# Patient Record
Sex: Male | Born: 1980 | Race: White | Hispanic: No | Marital: Married | Smoking: Never smoker
Health system: Southern US, Community
[De-identification: ages and names within clinical notes are randomized; demographics above are authoritative.]

## PROBLEM LIST (undated history)

## (undated) DIAGNOSIS — Z86711 Personal history of pulmonary embolism: Secondary | ICD-10-CM

## (undated) DIAGNOSIS — E7404 McArdle disease: Secondary | ICD-10-CM

## (undated) DIAGNOSIS — Z954 Presence of other heart-valve replacement: Secondary | ICD-10-CM

## (undated) HISTORY — PX: CARDIAC SURGERY: SHX584

## (undated) HISTORY — PX: ABDOMINAL AORTIC ANEURYSM REPAIR: SUR1152

## (undated) HISTORY — PX: AORTIC VALVE REPLACEMENT: SHX41

---

## 2002-11-20 ENCOUNTER — Emergency Department (HOSPITAL_COMMUNITY): Admission: EM | Admit: 2002-11-20 | Discharge: 2002-11-21 | Payer: Self-pay | Admitting: Emergency Medicine

## 2003-06-01 ENCOUNTER — Emergency Department (HOSPITAL_COMMUNITY): Admission: EM | Admit: 2003-06-01 | Discharge: 2003-06-01 | Payer: Self-pay | Admitting: Emergency Medicine

## 2004-03-07 ENCOUNTER — Emergency Department (HOSPITAL_COMMUNITY): Admission: EM | Admit: 2004-03-07 | Discharge: 2004-03-07 | Payer: Self-pay | Admitting: Emergency Medicine

## 2004-04-30 ENCOUNTER — Emergency Department (HOSPITAL_COMMUNITY): Admission: EM | Admit: 2004-04-30 | Discharge: 2004-04-30 | Payer: Self-pay | Admitting: Emergency Medicine

## 2004-05-02 ENCOUNTER — Emergency Department (HOSPITAL_COMMUNITY): Admission: EM | Admit: 2004-05-02 | Discharge: 2004-05-02 | Payer: Self-pay | Admitting: Emergency Medicine

## 2005-09-20 ENCOUNTER — Emergency Department (HOSPITAL_COMMUNITY): Admission: EM | Admit: 2005-09-20 | Discharge: 2005-09-21 | Payer: Self-pay | Admitting: Emergency Medicine

## 2005-09-22 ENCOUNTER — Ambulatory Visit: Payer: Self-pay | Admitting: Family Medicine

## 2006-04-27 ENCOUNTER — Ambulatory Visit: Payer: Self-pay | Admitting: Family Medicine

## 2006-08-22 ENCOUNTER — Emergency Department (HOSPITAL_COMMUNITY): Admission: EM | Admit: 2006-08-22 | Discharge: 2006-08-22 | Payer: Self-pay | Admitting: Emergency Medicine

## 2006-08-23 ENCOUNTER — Ambulatory Visit: Payer: Self-pay | Admitting: Family Medicine

## 2006-10-19 ENCOUNTER — Ambulatory Visit: Payer: Self-pay | Admitting: Family Medicine

## 2006-11-01 ENCOUNTER — Encounter: Admission: RE | Admit: 2006-11-01 | Discharge: 2006-11-01 | Payer: Self-pay | Admitting: Family Medicine

## 2006-12-21 ENCOUNTER — Ambulatory Visit: Payer: Self-pay | Admitting: Family Medicine

## 2007-05-03 ENCOUNTER — Encounter: Payer: Self-pay | Admitting: Family Medicine

## 2008-12-10 ENCOUNTER — Ambulatory Visit: Payer: Self-pay | Admitting: Family Medicine

## 2008-12-10 DIAGNOSIS — S61209A Unspecified open wound of unspecified finger without damage to nail, initial encounter: Secondary | ICD-10-CM | POA: Insufficient documentation

## 2010-03-13 ENCOUNTER — Encounter: Payer: Self-pay | Admitting: Family Medicine

## 2010-03-14 ENCOUNTER — Encounter: Payer: Self-pay | Admitting: Family Medicine

## 2010-06-05 ENCOUNTER — Emergency Department (INDEPENDENT_AMBULATORY_CARE_PROVIDER_SITE_OTHER): Payer: Self-pay

## 2010-06-05 ENCOUNTER — Emergency Department (HOSPITAL_BASED_OUTPATIENT_CLINIC_OR_DEPARTMENT_OTHER)
Admission: EM | Admit: 2010-06-05 | Discharge: 2010-06-05 | Disposition: A | Discharge status: Home / Self Care | Payer: Self-pay | Attending: Emergency Medicine | Admitting: Emergency Medicine

## 2010-06-05 DIAGNOSIS — R079 Chest pain, unspecified: Secondary | ICD-10-CM

## 2010-06-05 LAB — BASIC METABOLIC PANEL
BUN: 7 mg/dL (ref 6–23)
Calcium: 9.4 mg/dL (ref 8.4–10.5)
Creatinine, Ser: 0.9 mg/dL (ref 0.4–1.5)
GFR calc non Af Amer: >60 mL/min (ref >60)

## 2010-06-12 ENCOUNTER — Emergency Department (HOSPITAL_BASED_OUTPATIENT_CLINIC_OR_DEPARTMENT_OTHER)
Admission: EM | Admit: 2010-06-12 | Discharge: 2010-06-12 | Disposition: A | Discharge status: Home / Self Care | Payer: Self-pay | Attending: Emergency Medicine | Admitting: Emergency Medicine

## 2010-06-12 ENCOUNTER — Emergency Department (INDEPENDENT_AMBULATORY_CARE_PROVIDER_SITE_OTHER): Payer: Self-pay

## 2010-06-12 DIAGNOSIS — F411 Generalized anxiety disorder: Secondary | ICD-10-CM | POA: Insufficient documentation

## 2010-06-12 DIAGNOSIS — R51 Headache: Secondary | ICD-10-CM

## 2010-06-12 DIAGNOSIS — T43205A Adverse effect of unspecified antidepressants, initial encounter: Secondary | ICD-10-CM | POA: Insufficient documentation

## 2010-12-06 LAB — URINALYSIS, ROUTINE W REFLEX MICROSCOPIC
Glucose, UA: NEGATIVE
Ketones, ur: NEGATIVE
Specific Gravity, Urine: 1.009
pH: 7

## 2010-12-06 LAB — DIFFERENTIAL
Basophils Relative: 0
Eosinophils Relative: 5
Lymphocytes Relative: 26
Lymphs Abs: 2.3
Monocytes Absolute: 0.7
Neutro Abs: 5.4
Neutrophils Relative %: 60

## 2010-12-06 LAB — COMPREHENSIVE METABOLIC PANEL
Chloride: 106
GFR calc Af Amer: >60
Potassium: 3.7

## 2010-12-06 LAB — CBC
HCT: 46.2
Hemoglobin: 16.3
MCHC: 35.3
MCV: 85
Platelets: 245
RBC: 5.44
WBC: 9

## 2011-06-16 ENCOUNTER — Encounter: Payer: Self-pay | Admitting: *Deleted

## 2011-06-16 ENCOUNTER — Emergency Department
Admission: EM | Admit: 2011-06-16 | Discharge: 2011-06-16 | Disposition: A | Discharge status: Home / Self Care | Payer: Self-pay | Source: Home / Self Care | Attending: Emergency Medicine | Admitting: Emergency Medicine

## 2011-06-16 DIAGNOSIS — R109 Unspecified abdominal pain: Secondary | ICD-10-CM

## 2011-06-16 HISTORY — DX: McArdle disease: E74.04

## 2011-06-16 MED ORDER — DICYCLOMINE HCL 20 MG PO TABS: 20.0000 mg | ORAL_TABLET | Freq: Two times a day (BID) | ORAL | Status: DC

## 2011-06-16 NOTE — ED Notes (Signed)
Pt c/o stomach cramps and diarrhea his whole life, he is concerned that he has IBS. He has a family hx of IBS. He has taken pepto and immodium.

## 2011-06-16 NOTE — ED Provider Notes (Signed)
History     CSN: [id number]  Arrival date & time 06/16/11  1143   First MD Initiated Contact with Patient 06/16/11 1206      Chief Complaint  Patient presents with  . GI Problem    (Consider location/radiation/quality/duration/timing/severity/associated sxs/prior treatment) HPI This patient presents today with some cramps and diarrhea for his whole ice.  He does not have a PCP but is concerned that he has IBS.  His mom also has IBS.  He is taking Pepto-Bismol and Imodium in the past and these seemed to help a little bit.  He has noticed increased cramping this week and is tired of his symptoms so he is coming to be evaluated.  No urinary symptoms, no blood in the stool, no constipation, no nausea or vomiting.  His main symptoms consist of general abdominal cramping.  He has not noticed that it happens with any certain foods, however he did go to [employer] yesterday and thinks this flared up a little bit.  He is not allergic to anything.  He does not take any daily medications other than an occasional Xanax.  He has never seen a gastroenterologist in the past.  Past Medical History  Diagnosis Date  . McArdle's disease     History reviewed. No pertinent past surgical history.  Family History  Problem Relation Age of Onset  . Irritable bowel syndrome Mother   . Cancer Mother     thyroid    History  Substance Use Topics  . Smoking status: Never Smoker   . Smokeless tobacco: Not on file  . Alcohol Use: Yes      Review of Systems  All other systems reviewed and are negative.    Allergies  Review of patient's allergies indicates no known allergies.  Home Medications   Current Outpatient Rx  Name Route Sig Dispense Refill  . ALPRAZOLAM 1 MG PO TABS Oral Take 1 mg by mouth at bedtime as needed.    Marland Kitchen DICYCLOMINE HCL 20 MG PO TABS Oral Take 1 tablet (20 mg total) by mouth 2 (two) times daily. 32 tablet 0    BP 138/89  Pulse 69  Temp(Src) 98.4 F (36.9 C) (Oral)   Resp 16  Ht 5\' 6"  (1.676 m)  Wt 161 lb (73.029 kg)  BMI 25.99 kg/m2  SpO2 99%  Physical Exam  Nursing note and vitals reviewed. Constitutional: He is oriented to person, place, and time. He appears well-developed and well-nourished.  HENT:  Head: Normocephalic and atraumatic.  Eyes: No scleral icterus.  Neck: Neck supple.  Cardiovascular: Regular rhythm and normal heart sounds.   Pulmonary/Chest: Effort normal and breath sounds normal. No respiratory distress.  Abdominal: Soft. Normal appearance and bowel sounds are normal. There is tenderness in the periumbilical area. There is no rigidity, no rebound, no guarding, no CVA tenderness, no tenderness at McBurney's point and negative Murphy's sign.  Neurological: He is alert and oriented to person, place, and time.  Skin: Skin is warm and dry.  Psychiatric: He has a normal mood and affect. His speech is normal.    ED Course  Procedures (including critical care time)  Labs Reviewed - No data to display No results found.   1. Stomach cramps       MDM   This patient's symptoms are consistent with irritable bowel syndrome.  We're going to refer him to a gastroenterologist so can have long-term care for this.  I do not see any red is concerning with appendicitis or  other intra-abdominal processes.  I gave him a prescription for Bentyl to use for the next few days for his acute cramping.  I've also advised a healthy diet.  Also can consider testing such as a lactose-free diet or a gluten-free diet.  Marlaine Hind, MD 06/16/11 1225

## 2011-09-18 ENCOUNTER — Emergency Department (HOSPITAL_BASED_OUTPATIENT_CLINIC_OR_DEPARTMENT_OTHER): Payer: BC Managed Care – PPO

## 2011-09-18 ENCOUNTER — Encounter (HOSPITAL_BASED_OUTPATIENT_CLINIC_OR_DEPARTMENT_OTHER): Payer: Self-pay | Admitting: Emergency Medicine

## 2011-09-18 ENCOUNTER — Emergency Department (HOSPITAL_BASED_OUTPATIENT_CLINIC_OR_DEPARTMENT_OTHER)
Admission: EM | Admit: 2011-09-18 | Discharge: 2011-09-18 | Disposition: A | Discharge status: Home / Self Care | Payer: BC Managed Care – PPO | Attending: Emergency Medicine | Admitting: Emergency Medicine

## 2011-09-18 DIAGNOSIS — R079 Chest pain, unspecified: Secondary | ICD-10-CM | POA: Insufficient documentation

## 2011-09-18 DIAGNOSIS — E7400 Glycogen storage disease, unspecified: Secondary | ICD-10-CM | POA: Insufficient documentation

## 2011-09-18 MED ORDER — IBUPROFEN 800 MG PO TABS
800.0000 mg | ORAL_TABLET | Freq: Once | ORAL | Status: AC
  Administered 2011-09-18: 800 mg via ORAL
  Filled 2011-09-18: qty 1

## 2011-09-18 NOTE — ED Notes (Addendum)
Pt states had right pectoral pain times 1 month. "HX rhabdomyolysis but normally from pulling bigger muscles".  PT denies ShOB, diaphoresis.

## 2011-09-18 NOTE — ED Notes (Signed)
States for over a month has had right sided intermittent sharp pain.  Denies any other associated symptoms.  Denies any recent illness or unusual heavy lifting.

## 2011-09-18 NOTE — ED Provider Notes (Signed)
History     CSN: [id number]  Arrival date & time 09/18/11  4098   First MD Initiated Contact with Patient 09/18/11 [phone]      Chief Complaint  Patient presents with  . Chest Pain    (Consider location/radiation/quality/duration/timing/severity/associated sxs/prior treatment) HPI Patient is a 31 year old male with history of McArdle's disease who presents today complaining of one month of intermittent right-sided chest pain. This began today at rest. Normally this lasts a few minutes and then goes away. Today symptoms persisted and patient thought he should get checked out. He has no other medical problems and his chronic medical issue does not predispose him to thromboembolic disease. Patient has no personal history of DVT, PE, CAD, or obstructive lung disease. He denies any recent cough, fever, GI symptoms, GU symptoms, or shortness of breath. Patient denies any radiation of his pain and says it is very mild. He denies any variation with inspiration. He is not a smoker, has no history of hypertension, has no history of hyperlipidemia, and has no family history of early CAD. Patient is not currently seeing a primary care physician but has plans to obtain a new one. As he does not have a PCP he thought he should come in to get checked out in the emergency department today. There are no other associated or modifying factors. Past Medical History  Diagnosis Date  . McArdle's disease     History reviewed. No pertinent past surgical history.  Family History  Problem Relation Age of Onset  . Irritable bowel syndrome Mother   . Cancer Mother     thyroid    History  Substance Use Topics  . Smoking status: Never Smoker   . Smokeless tobacco: Not on file  . Alcohol Use: Yes      Review of Systems  Constitutional: Negative.   HENT: Negative.   Eyes: Negative.   Respiratory: Negative.   Cardiovascular: Positive for chest pain.  Gastrointestinal: Negative.   Genitourinary: Negative.    Musculoskeletal: Negative.   Skin: Negative.   Neurological: Negative.   Hematological: Negative.   Psychiatric/Behavioral: Negative.   All other systems reviewed and are negative.    Allergies  Review of patient's allergies indicates no known allergies.  Home Medications   Current Outpatient Rx  Name Route Sig Dispense Refill  . ALPRAZOLAM 1 MG PO TABS Oral Take 1 mg by mouth at bedtime as needed.    Marland Kitchen DICYCLOMINE HCL 20 MG PO TABS Oral Take 1 tablet (20 mg total) by mouth 2 (two) times daily. 32 tablet 0    BP 149/84  Pulse 70  Temp 97.7 F (36.5 C) (Oral)  Resp 16  Ht 5\' 6"  (1.676 m)  Wt 162 lb (73.483 kg)  BMI 26.15 kg/m2  SpO2 100%  Physical Exam  Nursing note and vitals reviewed. GEN: Well-developed, well-nourished male in no distress HEENT: Atraumatic, normocephalic. Oropharynx clear without erythema EYES: PERRLA BL, no scleral icterus. NECK: Trachea midline, no meningismus CV: regular rate and rhythm. No murmurs, rubs, or gallops PULM: No respiratory distress.  No crackles, wheezes, or rales. GI: soft, non-tender. No guarding, rebound, or tenderness. + bowel sounds  GU: deferred Neuro: cranial nerves grossly 2-12 intact, no abnormalities of strength or sensation, A and O x 3 MSK: Patient moves all 4 extremities symmetrically, no deformity, edema, or injury noted Skin: No rashes petechiae, purpura, or jaundice Psych: no abnormality of mood   ED Course  Procedures (including critical care time)  Indication: chest  pain Please note this EKG was reviewed extemporaneously by myself.   Date: 09/18/2011  Rate: 65  Rhythm: normal sinus rhythm  QRS Axis: normal  Intervals: normal  ST/T Wave abnormalities: normal  Conduction Disutrbances:none  Narrative Interpretation:   Old EKG Reviewed: unchanged      Labs Reviewed - No data to display Dg Chest 2 View  09/18/2011  *RADIOLOGY REPORT*  Clinical Data: Chest pain  CHEST - 2 VIEW  Comparison:  06/05/2010   Findings:  The heart size and mediastinal contours are within normal limits.  Both lungs are clear.  The visualized skeletal structures are unremarkable.  IMPRESSION: No active cardiopulmonary disease.  Original Report Authenticated By: Judie Petit. Ruel Favors, M.D.     1. Chest pain       MDM  Patient was evaluated by myself. Based on evaluation patient was very low risk for any emergent causes of chest pain such as ACS, thromboembolic disease, or pneumonia. Patient no symptoms to suggest dissection either. Given his low risk profile patient did have a chest x-ray and EKG. Patient was PERC negative and did not require further evaluation for this. EKG was completely within normal limits. Patient was given ibuprofen for his pain. He knew of no injuries and was concerned as he did not like this felt like a muscle pull. This also felt in no way like patient's previous episodes of rhabdomyolysis that he has had associated with his McArdle's disease. Patient was given a dose of ibuprofen. Chest x-ray was performed. This was negative as well. Patient was reassured and told to followup with a primary care physician.        Cyndra Numbers, MD 09/18/11 [phone]

## 2012-09-23 DIAGNOSIS — F419 Anxiety disorder, unspecified: Secondary | ICD-10-CM | POA: Insufficient documentation

## 2015-06-28 ENCOUNTER — Emergency Department (INDEPENDENT_AMBULATORY_CARE_PROVIDER_SITE_OTHER)
Admission: EM | Admit: 2015-06-28 | Discharge: 2015-06-28 | Disposition: A | Discharge status: Home / Self Care | Payer: 59 | Source: Home / Self Care | Attending: Family Medicine | Admitting: Family Medicine

## 2015-06-28 ENCOUNTER — Encounter: Payer: Self-pay | Admitting: *Deleted

## 2015-06-28 DIAGNOSIS — H1131 Conjunctival hemorrhage, right eye: Secondary | ICD-10-CM

## 2015-06-28 DIAGNOSIS — S0501XA Injury of conjunctiva and corneal abrasion without foreign body, right eye, initial encounter: Secondary | ICD-10-CM

## 2015-06-28 MED ORDER — POLYMYXIN B-TRIMETHOPRIM 10000-0.1 UNIT/ML-% OP SOLN: 1.0000 [drp] | OPHTHALMIC | Status: DC

## 2015-06-28 NOTE — ED Provider Notes (Signed)
CSN: [id number]     Arrival date & time 06/28/15  6578 History   First MD Initiated Contact with Patient 06/28/15 [phone]     Chief Complaint  Patient presents with  . Eye Problem   (Consider location/radiation/quality/duration/timing/severity/associated sxs/prior Treatment) HPI The pt is a 35yo male presenting to [facility] with c/o Right eye redness and irritation that started 2 days ago.  Pt states he felt like he had an eyelash in his eye so he rubbed it.  Denies pain or itching since then but has noticed redness and a "black dot" on the right side of his eye.  Pt wears glasses but never contacts. He tried looking for a foreign body in his eyes but did not see any.  Denies recent cough, congestion, vomiting or other instance of increased pressure to his eye.   Past Medical History  Diagnosis Date  . McArdle's disease [facility])    Past Surgical History  Procedure Laterality Date  . Aortic valve replacement    . Abdominal aortic aneurysm repair     Family History  Problem Relation Age of Onset  . Irritable bowel syndrome Mother   . Cancer Mother     thyroid   Social History  Substance Use Topics  . Smoking status: Never Smoker   . Smokeless tobacco: None  . Alcohol Use: No    Review of Systems  Constitutional: Negative for fever and chills.  HENT: Negative for congestion and rhinorrhea.   Eyes: Positive for redness. Negative for photophobia, pain, discharge, itching and visual disturbance.  Respiratory: Negative for cough and wheezing.   Neurological: Negative for dizziness, light-headedness and headaches.    Allergies  Dilaudid  Home Medications   Prior to Admission medications   Medication Sig Start Date End Date Taking? Authorizing Provider  metoprolol succinate (TOPROL-XL) 25 MG 24 hr tablet Take 25 mg by mouth daily.   Yes Historical Provider, MD  rivaroxaban (XARELTO) 20 MG TABS tablet Take 20 mg by mouth daily with supper.   Yes Historical Provider, MD  ALPRAZolam Prudy Feeler)  1 MG tablet Take 1 mg by mouth at bedtime as needed.    Historical Provider, MD  trimethoprim-polymyxin b (POLYTRIM) ophthalmic solution Place 1 drop into the right eye every 4 (four) hours. For 5 days 06/28/15   Junius Finner, PA-C   Meds Ordered and Administered this Visit  Medications - No data to display  BP 143/94 mmHg  Pulse 64  Temp(Src) 98.2 F (36.8 C) (Oral)  Resp 16  Ht  (1.676 m)  Wt 171 lb (77.565 kg)  BMI 27.61 kg/m2  SpO2 99% No data found.   Physical Exam  Constitutional: He is oriented to person, place, and time. He appears well-developed and well-nourished.  HENT:  Head: Normocephalic and atraumatic.  Eyes: EOM and lids are normal. Pupils are equal, round, and reactive to light. Lids are everted and swept, no foreign bodies found. No foreign body present in the right eye. Right conjunctiva is injected. Right conjunctiva has a hemorrhage.    Right eye: small area of subconjunctival hemorrhage.  Small amount of fluorescein uptake inferior to hemorrhage.  No foreign bodies seen.   Neck: Normal range of motion.  Cardiovascular: Normal rate.   Pulmonary/Chest: Effort normal.  Musculoskeletal: Normal range of motion.  Neurological: He is alert and oriented to person, place, and time.  Skin: Skin is warm and dry.  Psychiatric: He has a normal mood and affect. His behavior is normal.  Nursing note  and vitals reviewed.   ED Course  Procedures (including critical care time)  Labs Review Labs Reviewed - No data to display  Imaging Review No results found.   Visual Acuity Review  Right Eye Distance: 20/20 Left Eye Distance: 20/20 Bilateral Distance: 20/20 (w/ glasses)  Right Eye Near:   Left Eye Near:    Bilateral Near:      MDM   1. Right corneal abrasion, initial encounter   2. Subconjunctival hemorrhage of right eye    [facility] with Right eye redness after initial irritation.   Exam c/w subconjunctival hemorrhage and corneal  abrasion.    Reassured pt no foreign bodies seen on exam.  Rx: Polytrim  Encouraged to limit rubbing of his eye. F/u with PCP or ophthalmologist in 2-3 days if not improving, especially if symptoms worsening. Patient verbalized understanding and agreement with treatment plan.      Junius FinnerErin O'Malley, PA-C 06/28/15 [phone]

## 2015-06-28 NOTE — ED Notes (Signed)
Pt c/o RT eye redness and watery d/c x 2 days. Denies itching, pain or fever.

## 2015-06-28 NOTE — Discharge Instructions (Signed)
Corneal Abrasion °The cornea is the clear covering at the front and center of the eye. When looking at the colored portion of the eye (iris), you are looking through the cornea. This very thin tissue is made up of many layers. The surface layer is a single layer of cells (corneal epithelium) and is one of the most sensitive tissues in the body. If a scratch or injury causes the corneal epithelium to come off, it is called a corneal abrasion. If the injury extends to the tissues below the epithelium, the condition is called a corneal ulcer. °CAUSES  °· Scratches. °· Trauma. °· Foreign body in the eye. °Some people have recurrences of abrasions in the area of the original injury even after it has healed (recurrent erosion syndrome). Recurrent erosion syndrome generally improves and goes away with time. °SYMPTOMS  °· Eye pain. °· Difficulty or inability to keep the injured eye open. °· The eye becomes very sensitive to light. °· Recurrent erosions tend to happen suddenly, first thing in the morning, usually after waking up and opening the eye. °DIAGNOSIS  °Your health care provider can diagnose a corneal abrasion during an eye exam. Dye is usually placed in the eye using a drop or a small paper strip moistened by your tears. When the eye is examined with a special light, the abrasion shows up clearly because of the dye. °TREATMENT  °· Small abrasions may be treated with antibiotic drops or ointment alone. °· A pressure patch may be put over the eye. If this is done, follow your doctor's instructions for when to remove the patch. Do not drive or use machines while the eye patch is on. Judging distances is hard to do with a patch on. °If the abrasion becomes infected and spreads to the deeper tissues of the cornea, a corneal ulcer can result. This is serious because it can cause corneal scarring. Corneal scars interfere with light passing through the cornea and cause a loss of vision in the involved eye. °HOME CARE  INSTRUCTIONS °· Use medicine or ointment as directed. Only take over-the-counter or prescription medicines for pain, discomfort, or fever as directed by your health care provider. °· Do not drive or operate machinery if your eye is patched. Your ability to judge distances is impaired. °· If your health care provider has given you a follow-up appointment, it is very important to keep that appointment. Not keeping the appointment could result in a severe eye infection or permanent loss of vision. If there is any problem keeping the appointment, let your health care provider know. °SEEK MEDICAL CARE IF:  °· You have pain, light sensitivity, and a scratchy feeling in one eye or both eyes. °· Your pressure patch keeps loosening up, and you can blink your eye under the patch after treatment. °· Any kind of discharge develops from the eye after treatment or if the lids stick together in the morning. °· You have the same symptoms in the morning as you did with the original abrasion days, weeks, or months after the abrasion healed. °  °This information is not intended to replace advice given to you by your health care provider. Make sure you discuss any questions you have with your health care provider. °  °Document Released: 02/04/2000 Document Revised: 10/28/2014 Document Reviewed: 10/14/2012 °Elsevier Interactive Patient Education ©2016 [employer] ° °Subconjunctival Hemorrhage °Subconjunctival hemorrhage is bleeding that happens between the white part of your eye (sclera) and the clear membrane that covers the outside of   your eye (conjunctiva). There are many tiny blood vessels near the surface of your eye. A subconjunctival hemorrhage happens when one or more of these vessels breaks and bleeds, causing a red patch to appear on your eye. This is similar to a bruise. °Depending on the amount of bleeding, the red patch may only cover a small area of your eye or it may cover the entire visible part of the sclera. If a lot  of blood collects under the conjunctiva, there may also be swelling. Subconjunctival hemorrhages do not affect your vision or cause pain, but your eye may feel irritated if there is swelling. Subconjunctival hemorrhages usually do not require treatment, and they disappear on their own within two weeks. °CAUSES °This condition may be caused by: °· Mild trauma, such as rubbing your eye too hard. °· Severe trauma or blunt injuries. °· Coughing, sneezing, or vomiting. °· Straining, such as when lifting a heavy object. °· High blood pressure. °· Recent eye surgery. °· A history of diabetes. °· Certain medicines, especially blood thinners (anticoagulants). °· Other conditions, such as eye tumors, bleeding disorders, or blood vessel abnormalities. °Subconjunctival hemorrhages can happen without an obvious cause.  °SYMPTOMS  °Symptoms of this condition include: °· A bright red or dark red patch on the white part of the eye. °¨ The red area may spread out to cover a larger area of the eye before it goes away. °¨ The red area may turn brownish-yellow before it goes away. °· Swelling. °· Mild eye irritation. °DIAGNOSIS °This condition is diagnosed with a physical exam. If your subconjunctival hemorrhage was caused by trauma, your health care provider may refer you to an eye specialist (ophthalmologist) or another specialist to check for other injuries. You may have other tests, including: °· An eye exam. °· A blood pressure check. °· Blood tests to check for bleeding disorders. °If your subconjunctival hemorrhage was caused by trauma, X-rays or a CT scan may be done to check for other injuries. °TREATMENT °Usually, no treatment is needed. Your health care provider may recommend eye drops or cold compresses to help with discomfort. °HOME CARE INSTRUCTIONS °· Take over-the-counter and prescription medicines only as directed by your health care provider. °· Use eye drops or cold compresses to help with discomfort as directed by  your health care provider. °· Avoid activities, things, and environments that may irritate or injure your eye. °· Keep all follow-up visits as told by your health care provider. This is important. °SEEK MEDICAL CARE IF: °· You have pain in your eye. °· The bleeding does not go away within 3 weeks. °· You keep getting new subconjunctival hemorrhages. °SEEK IMMEDIATE MEDICAL CARE IF: °· Your vision changes or you have difficulty seeing. °· You suddenly develop severe sensitivity to light. °· You develop a severe headache, persistent vomiting, confusion, or abnormal tiredness (lethargy). °· Your eye seems to bulge or protrude from your eye socket. °· You develop unexplained bruises on your body. °· You have unexplained bleeding in another area of your body. °  °This information is not intended to replace advice given to you by your health care provider. Make sure you discuss any questions you have with your health care provider. °  °Document Released: 02/06/2005 Document Revised: 10/28/2014 Document Reviewed: 04/15/2014 °Elsevier Interactive Patient Education ©2016 [employer] ° °

## 2016-11-06 DIAGNOSIS — Z86711 Personal history of pulmonary embolism: Secondary | ICD-10-CM | POA: Insufficient documentation

## 2017-11-16 ENCOUNTER — Encounter: Payer: Self-pay | Admitting: *Deleted

## 2017-11-16 ENCOUNTER — Emergency Department
Admission: EM | Admit: 2017-11-16 | Discharge: 2017-11-16 | Disposition: A | Discharge status: Home / Self Care | Payer: Self-pay | Source: Home / Self Care | Attending: Family Medicine | Admitting: Family Medicine

## 2017-11-16 ENCOUNTER — Other Ambulatory Visit: Payer: Self-pay

## 2017-11-16 DIAGNOSIS — S29012A Strain of muscle and tendon of back wall of thorax, initial encounter: Secondary | ICD-10-CM

## 2017-11-16 MED ORDER — ACETAMINOPHEN 500 MG PO TABS
1000.0000 mg | ORAL_TABLET | Freq: Once | ORAL | Status: AC
  Administered 2017-11-16: 1000 mg via ORAL

## 2017-11-16 MED ORDER — LIDOCAINE 5 % EX PTCH: MEDICATED_PATCH | CUTANEOUS | 1 refills | Status: DC

## 2017-11-16 MED ORDER — CYCLOBENZAPRINE HCL 10 MG PO TABS: 10.0000 mg | ORAL_TABLET | Freq: Three times a day (TID) | ORAL | 0 refills | Status: DC

## 2017-11-16 NOTE — ED Provider Notes (Signed)
[name]    CSN: [id number] Arrival date & time: 11/16/17  1259     History   Chief Complaint Chief Complaint  Patient presents with  . Back Pain    HPI Randy Frank is a 37 y.o. male.   While pulling a heavy microwave oven out of his car trunk one hour ago, patient felt a popping sensation in his right mid-back, followed by severe pain.    The history is provided by the patient.  Back Pain  Location:  Thoracic spine Quality:  Shooting and stabbing Radiates to:  Does not radiate Pain severity:  Severe Pain is:  Same all the time Onset quality:  Gradual Duration:  1 hour Timing:  Constant Progression:  Unchanged Context: lifting heavy objects   Relieved by:  None tried Worsened by:  Deep breathing and movement Ineffective treatments:  None tried Associated symptoms: no abdominal pain, no bladder incontinence, no bowel incontinence, no chest pain, no dysuria, no fever, no headaches, no leg pain, no numbness, no paresthesias, no pelvic pain, no perianal numbness, no tingling, no weakness and no weight loss     Past Medical History:  Diagnosis Date  . 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 LACERATION OF FINGER 12/10/2008    Past Surgical History:  Procedure Laterality Date  . ABDOMINAL AORTIC ANEURYSM REPAIR    . AORTIC VALVE REPLACEMENT         Home Medications    Prior to Admission medications   Medication Sig Start Date End Date Taking? Authorizing Provider  metoprolol succinate (TOPROL-XL) 25 MG 24 hr tablet Take 25 mg by mouth daily.   Yes [provider]  rivaroxaban (XARELTO) 10 MG TABS tablet Take 10 mg by mouth daily.   Yes [provider]  ALPRAZolam Prudy Feeler) 1 MG tablet Take 1 mg by mouth at bedtime as needed.    [provider]  cyclobenzaprine (FLEXERIL) 10 MG tablet Take 1 tablet (10 mg total) by mouth 3 (three) times daily. 11/16/17   Lattie Haw, MD    lidocaine (LIDODERM) 5 % Apply one patch to affected area daily.  Remove & Discard patch within 12 hours. 11/16/17   Lattie Haw, MD    Family History Family History  Problem Relation Age of Onset  . Irritable bowel syndrome Mother   . Cancer Mother        thyroid    Social History Social History   Tobacco Use  . Smoking status: Never Smoker  . Smokeless tobacco: Never Used  Substance Use Topics  . Alcohol use: No  . Drug use: No     Allergies   Dilaudid [hydromorphone]   Review of Systems Review of Systems  Constitutional: Negative for fever and weight loss.  Cardiovascular: Negative for chest pain.  Gastrointestinal: Negative for abdominal pain and bowel incontinence.  Genitourinary: Negative for bladder incontinence, dysuria and pelvic pain.  Musculoskeletal: Positive for back pain.  Neurological: Negative for tingling, weakness, numbness, headaches and paresthesias.  All other systems reviewed and are negative.    Physical Exam Triage Vital Signs ED Triage Vitals  Enc Vitals Group     BP 11/16/17 1315 137/81     Pulse Rate 11/16/17 1315 60     Resp 11/16/17 1315 18     Temp --      Temp src --      SpO2 11/16/17 1315 100 %  Weight 11/16/17 1317 165 lb (74.8 kg)     Height --      Head Circumference --      Peak Flow --      Pain Score 11/16/17 1316 8     Pain Loc --      Pain Edu? --      Excl. in [place]? --    No data found.  Updated Vital Signs BP 137/81 (BP Location: Right Arm)   Pulse 60   Resp 18   Wt 74.8 kg   SpO2 100%   BMI 26.63 kg/m   Visual Acuity Right Eye Distance:   Left Eye Distance:   Bilateral Distance:    Right Eye Near:   Left Eye Near:    Bilateral Near:     Physical Exam  Constitutional: He appears well-developed and well-nourished. No distress.  HENT:  Head: Atraumatic.  Eyes: Pupils are equal, round, and reactive to light. Conjunctivae are normal.  Neck: Normal range of motion.  Cardiovascular: Normal  heart sounds.  Pulmonary/Chest: Breath sounds normal.  Abdominal: There is no tenderness.  Musculoskeletal: He exhibits no edema or tenderness.       Back:  Tenderness to palpation mid-thoracic paraspinous muscles and intercostal muscles as noted on diagram.   Neurological: He is alert. He displays normal reflexes. No cranial nerve deficit.  Skin: Skin is warm and dry.  Nursing note and vitals reviewed.    UC Treatments / Results  Labs (all labs ordered are listed, but only abnormal results are displayed) Labs Reviewed - No data to display  EKG None  Radiology No results found.  Procedures Procedures (including critical care time)  Medications Ordered in UC Medications  acetaminophen (TYLENOL) tablet 1,000 mg (1,000 mg Oral Given 11/16/17 1325)    Initial Impression / Assessment and Plan / UC Course  I have reviewed the triage vital signs and the nursing notes.  Pertinent labs & imaging results that were available during my care of the patient were reviewed by me and considered in my medical decision making (see chart for details).    Begin Flexeril and Lidocaine patch. Followup with Dr. Rodney Langton or Dr. Clementeen [facility] ([facility]) if not improving about two weeks.    Final Clinical Impressions(s) / UC Diagnoses   Final diagnoses:  Strain of thoracic back region     Discharge Instructions     Apply ice pack for 20 to 30 minutes, 3 to 4 times daily  Continue until pain and swelling decrease.  May take Tylenol as needed for pain.    ED Prescriptions    Medication Sig Dispense Auth. Provider   cyclobenzaprine (FLEXERIL) 10 MG tablet Take 1 tablet (10 mg total) by mouth 3 (three) times daily. 30 tablet Lattie Haw, MD   lidocaine (LIDODERM) 5 % Apply one patch to affected area daily.  Remove & Discard patch within 12 hours. 12 patch Lattie Haw, MD        Lattie Haw, MD 11/19/17 2150

## 2017-11-16 NOTE — ED Triage Notes (Signed)
Patient reports while pulling a microwave out of his trunk, he turned and felt a pop and instant severe pain. Pain is in mid-back. No previous injuries.

## 2017-11-16 NOTE — Discharge Instructions (Signed)
Apply ice pack for 20 to 30 minutes, 3 to 4 times daily  Continue until pain and swelling decrease.  May take Tylenol as needed for pain. 

## 2019-02-07 ENCOUNTER — Emergency Department
Admission: EM | Admit: 2019-02-07 | Discharge: 2019-02-07 | Disposition: A | Discharge status: Home / Self Care | Payer: Managed Care, Other (non HMO) | Source: Home / Self Care | Attending: Family Medicine | Admitting: Family Medicine

## 2019-02-07 ENCOUNTER — Encounter: Payer: Self-pay | Admitting: Emergency Medicine

## 2019-02-07 ENCOUNTER — Other Ambulatory Visit: Payer: Self-pay

## 2019-02-07 DIAGNOSIS — R599 Enlarged lymph nodes, unspecified: Secondary | ICD-10-CM

## 2019-02-07 LAB — POCT RAPID STREP A (OFFICE): Rapid Strep A Screen: NEGATIVE

## 2019-02-07 NOTE — ED Triage Notes (Signed)
Woke up this morning with swollen gland, left neck

## 2019-02-07 NOTE — Discharge Instructions (Addendum)
May take Ibuprofen 200mg , 4 tabs every 8 hours with food if needed for sore throat, headache, etc.

## 2019-02-09 LAB — CULTURE, GROUP A STREP
MICRO NUMBER:: 1216260
SPECIMEN QUALITY:: ADEQUATE

## 2019-02-09 LAB — STREP A DNA PROBE

## 2019-02-16 NOTE — ED Provider Notes (Signed)
[name]    CSN: [id number] Arrival date & time: 02/07/19  1142      History   Chief Complaint Chief Complaint  Patient presents with  . Adenopathy    HPI Randy Frank is a 38 y.o. male.   Patient awoke this morning with soreness in a left neck lymph node without other symptoms.  He denies URI symptoms, feels well, and has no sore throat.  The history is provided by the patient.    Past Medical History:  Diagnosis Date  . 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 LACERATION OF FINGER 12/10/2008    Past Surgical History:  Procedure Laterality Date  . ABDOMINAL AORTIC ANEURYSM REPAIR    . AORTIC VALVE REPLACEMENT    . CARDIAC SURGERY         Home Medications    Prior to Admission medications   Medication Sig Start Date End Date Taking? Authorizing Provider  lisinopril (ZESTRIL) 10 MG tablet Take 10 mg by mouth daily.   Yes [provider]  ALPRAZolam Duanne Moron) 1 MG tablet Take 1 mg by mouth at bedtime as needed.    [provider]  cyclobenzaprine (FLEXERIL) 10 MG tablet Take 1 tablet (10 mg total) by mouth 3 (three) times daily. 11/16/17   Kandra Nicolas, MD  lidocaine (LIDODERM) 5 % Apply one patch to affected area daily.  Remove & Discard patch within 12 hours. 11/16/17   Kandra Nicolas, MD  metoprolol succinate (TOPROL-XL) 25 MG 24 hr tablet Take 25 mg by mouth daily.    [provider]  rivaroxaban (XARELTO) 10 MG TABS tablet Take 10 mg by mouth daily.    [provider]    Family History Family History  Problem Relation Age of Onset  . Irritable bowel syndrome Mother   . Cancer Mother        thyroid    Social History Social History   Tobacco Use  . Smoking status: Never Smoker  . Smokeless tobacco: Never Used  Substance Use Topics  . Alcohol use: No  . Drug use: No     Allergies   Dilaudid [hydromorphone]   Review of Systems Review of  Systems No sore throat No cough No pleuritic pain No wheezing No nasal congestion No post-nasal drainage No sinus pain/pressure No itchy/red eyes No earache No hemoptysis No SOB No fever/chills No nausea No vomiting No abdominal pain No diarrhea No urinary symptoms No skin rash No fatigue No myalgias No headache   Physical Exam Triage Vital Signs ED Triage Vitals  Enc Vitals Group     BP 02/07/19 1238 125/88     Pulse Rate 02/07/19 1238 66     Resp --      Temp 02/07/19 1238 98 F (36.7 C)     Temp Source 02/07/19 1238 Oral     SpO2 02/07/19 1238 97 %     Weight 02/07/19 1240 172 lb (78 kg)     Height 02/07/19 1240 5\' 6"  (1.676 m)     Head Circumference --      Peak Flow --      Pain Score 02/07/19 1239 5     Pain Loc --      Pain Edu? --      Excl. in [place]? --    No data found.  Updated Vital Signs BP 125/88 (BP Location: Right Arm)   Pulse 66  Temp 98 F (36.7 C) (Oral)   Ht 5\' 6"  (1.676 m)   Wt 78 kg   SpO2 97%   BMI 27.76 kg/m   Visual Acuity Right Eye Distance:   Left Eye Distance:   Bilateral Distance:    Right Eye Near:   Left Eye Near:    Bilateral Near:     Physical Exam Nursing notes and Vital Signs reviewed. Appearance:  Patient appears stated age, and in no acute distress Eyes:  Pupils are equal, round, and reactive to light and accomodation.  Extraocular movement is intact.  Conjunctivae are not inflamed  Ears:  Canals normal.  Tympanic membranes normal.  Nose:  Normal turbinates.  No sinus tenderness.  Pharynx:  Normal Neck:  Supple.  Tender slightly enlarged left tonsillar node  Lungs:  Clear to auscultation.  Breath sounds are equal.  Moving air well. Heart:  Regular rate and rhythm without murmurs, rubs, or gallops.  Abdomen:  Nontender without masses or hepatosplenomegaly.  Bowel sounds are present.  No CVA or flank tenderness.  Extremities:  No edema.  Skin:  No rash present.   UC Treatments / Results  Labs (all labs  ordered are listed, but only abnormal results are displayed) Labs Reviewed  STREP A DNA PROBE  CULTURE, GROUP A STREP  POCT RAPID STREP A (OFFICE) negative    EKG   Radiology No results found.  Procedures Procedures (including critical care time)  Medications Ordered in UC Medications - No data to display  Initial Impression / Assessment and Plan / UC Course  I have reviewed the triage vital signs and the nursing notes.  Pertinent labs & imaging results that were available during my care of the patient were reviewed by me and considered in my medical decision making (see chart for details).    Treat symptomatically for now.  Throat culture pending.  Final Clinical Impressions(s) / UC Diagnoses   Final diagnoses:  Swollen lymph nodes     Discharge Instructions     May take Ibuprofen 200mg , 4 tabs every 8 hours with food if needed for sore throat, headache, etc.   ED Prescriptions    None        , MD 02/16/19 1545

## 2019-02-17 NOTE — ED Notes (Signed)
Pt here for updated note. Wanted it to say return tomorrow but we will not write out for 10 days. Needs to be seen again if so.

## 2019-09-23 ENCOUNTER — Ambulatory Visit: Payer: Self-pay

## 2019-10-12 ENCOUNTER — Other Ambulatory Visit: Payer: Self-pay

## 2019-10-12 ENCOUNTER — Emergency Department
Admission: RE | Admit: 2019-10-12 | Discharge: 2019-10-12 | Disposition: A | Discharge status: Home / Self Care | Payer: Self-pay | Source: Ambulatory Visit

## 2019-10-12 ENCOUNTER — Emergency Department (INDEPENDENT_AMBULATORY_CARE_PROVIDER_SITE_OTHER): Payer: Self-pay

## 2019-10-12 VITALS — BP 120/81 | HR 87 | Temp 99.5°F

## 2019-10-12 DIAGNOSIS — R05 Cough: Secondary | ICD-10-CM

## 2019-10-12 DIAGNOSIS — R509 Fever, unspecified: Secondary | ICD-10-CM

## 2019-10-12 DIAGNOSIS — R0602 Shortness of breath: Secondary | ICD-10-CM

## 2019-10-12 DIAGNOSIS — Z20822 Contact with and (suspected) exposure to covid-19: Secondary | ICD-10-CM

## 2019-10-12 DIAGNOSIS — J069 Acute upper respiratory infection, unspecified: Secondary | ICD-10-CM

## 2019-10-12 MED ORDER — DOXYCYCLINE HYCLATE 100 MG PO CAPS: 100.0000 mg | ORAL_CAPSULE | Freq: Two times a day (BID) | ORAL | 0 refills | Status: AC

## 2019-10-12 MED ORDER — ALBUTEROL SULFATE HFA 108 (90 BASE) MCG/ACT IN AERS: 1.0000 | INHALATION_SPRAY | Freq: Four times a day (QID) | RESPIRATORY_TRACT | 0 refills | Status: AC | PRN

## 2019-10-12 MED ORDER — BENZONATATE 100 MG PO CAPS: 100.0000 mg | ORAL_CAPSULE | Freq: Three times a day (TID) | ORAL | 0 refills | Status: DC

## 2019-10-12 MED ORDER — PREDNISONE 20 MG PO TABS: 40.0000 mg | ORAL_TABLET | Freq: Every day | ORAL | 0 refills | Status: AC

## 2019-10-12 NOTE — ED Triage Notes (Signed)
Patient has had a cough x 10 days, fever, wife tested positive for COVID several weeks ago.  Patient has taken 3 COVID tests all negative.  Patient is SOB.  Patient is not vaccinated.

## 2019-10-12 NOTE — ED Provider Notes (Signed)
[facility]    CSN: [id number] Arrival date & time: 10/12/19  0954      History   Chief Complaint Chief Complaint  Patient presents with  . Cough    HPI Randy Frank is a 39 y.o. male.   HPI  Randy Frank is a 39 y.o. male presenting to UC with c/o cough for 10 days, low grade fever. Denies chest pain but has had mild SOB.  He has had 2 negative home Covid-19 tests and one negative covid test at a local pharmacy.  Pt is not vaccinated. States his wife tested positive for Covid 3 weeks ago.     Past Medical History:  Diagnosis Date  . 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 LACERATION OF FINGER 12/10/2008    Past Surgical History:  Procedure Laterality Date  . ABDOMINAL AORTIC ANEURYSM REPAIR    . AORTIC VALVE REPLACEMENT    . CARDIAC SURGERY         Home Medications    Prior to Admission medications   Medication Sig Start Date End Date Taking? Authorizing Provider  ALPRAZolam Prudy Feeler) 1 MG tablet Take 1 mg by mouth at bedtime as needed.   Yes [provider]  cyclobenzaprine (FLEXERIL) 10 MG tablet Take 1 tablet (10 mg total) by mouth 3 (three) times daily. 11/16/17  Yes Lattie Haw, MD  lidocaine (LIDODERM) 5 % Apply one patch to affected area daily.  Remove & Discard patch within 12 hours. 11/16/17  Yes Lattie Haw, MD  lisinopril (ZESTRIL) 10 MG tablet Take 10 mg by mouth daily.   Yes [provider]  metoprolol succinate (TOPROL-XL) 25 MG 24 hr tablet Take 25 mg by mouth daily.   Yes [provider]  rivaroxaban (XARELTO) 10 MG TABS tablet Take 10 mg by mouth daily.   Yes [provider]  albuterol (VENTOLIN HFA) 108 (90 Base) MCG/ACT inhaler Inhale 1-2 puffs into the lungs every 6 (six) hours as needed for wheezing or shortness of breath. 10/12/19   Lurene Shadow, PA-C  benzonatate (TESSALON) 100 MG capsule Take 1-2 capsules (100-200 mg total) by mouth  every 8 (eight) hours. 10/12/19   Lurene Shadow, PA-C  doxycycline (VIBRAMYCIN) 100 MG capsule Take 1 capsule (100 mg total) by mouth 2 (two) times daily for 7 days. 10/12/19 10/19/19  Lurene Shadow, PA-C  predniSONE (DELTASONE) 20 MG tablet Take 2 tablets (40 mg total) by mouth daily with breakfast for 4 days. 10/12/19 10/16/19  Lurene Shadow, PA-C    Family History Family History  Problem Relation Age of Onset  . Irritable bowel syndrome Mother   . Cancer Mother        thyroid    Social History Social History   Tobacco Use  . Smoking status: Never Smoker  . Smokeless tobacco: Never Used  Vaping Use  . Vaping Use: Never used  Substance Use Topics  . Alcohol use: No  . Drug use: No     Allergies   Dilaudid [hydromorphone]   Review of Systems Review of Systems  Constitutional: Positive for fatigue and fever. Negative for chills.  HENT: Positive for congestion and postnasal drip. Negative for ear pain, sore throat, trouble swallowing and voice change.   Respiratory: Positive for cough, chest tightness and shortness of breath.   Cardiovascular: Negative for chest pain and palpitations.  Gastrointestinal: Negative for abdominal pain, diarrhea,  nausea and vomiting.  Musculoskeletal: Negative for arthralgias, back pain and myalgias.  Skin: Negative for rash.  All other systems reviewed and are negative.    Physical Exam Triage Vital Signs ED Triage Vitals  Enc Vitals Group     BP 10/12/19 1048 120/81     Pulse Rate 10/12/19 1048 87     Resp --      Temp 10/12/19 1048 99.5 F (37.5 C)     Temp Source 10/12/19 1048 Oral     SpO2 10/12/19 1048 97 %     Weight --      Height --      Head Circumference --      Peak Flow --      Pain Score 10/12/19 1049 0     Pain Loc --      Pain Edu? --      Excl. in [place]? --    No data found.  Updated Vital Signs BP 120/81 (BP Location: Left Arm)   Pulse 87   Temp 99.5 F (37.5 C) (Oral)   SpO2 97%   Visual Acuity Right  Eye Distance:   Left Eye Distance:   Bilateral Distance:    Right Eye Near:   Left Eye Near:    Bilateral Near:     Physical Exam Vitals and nursing note reviewed.  Constitutional:      General: He is not in acute distress.    Appearance: Normal appearance. He is well-developed. He is not ill-appearing, toxic-appearing or diaphoretic.  HENT:     Head: Normocephalic and atraumatic.     Right Ear: Tympanic membrane and ear canal normal.     Left Ear: Tympanic membrane and ear canal normal.     Nose: Nose normal.     Mouth/Throat:     Lips: Pink.     Mouth: Mucous membranes are moist.     Pharynx: Oropharynx is clear. Uvula midline. No pharyngeal swelling, oropharyngeal exudate, posterior oropharyngeal erythema or uvula swelling.  Cardiovascular:     Rate and Rhythm: Normal rate and regular rhythm.  Pulmonary:     Effort: Pulmonary effort is normal. No respiratory distress.     Breath sounds: No stridor. Rhonchi (faint diffuse ) present. No wheezing or rales.  Musculoskeletal:        General: Normal range of motion.     Cervical back: Normal range of motion and neck supple. No tenderness.  Lymphadenopathy:     Cervical: No cervical adenopathy.  Skin:    General: Skin is warm and dry.  Neurological:     Mental Status: He is alert and oriented to person, place, and time.  Psychiatric:        Behavior: Behavior normal.      UC Treatments / Results  Labs (all labs ordered are listed, but only abnormal results are displayed) Labs Reviewed  NOVEL CORONAVIRUS, NAA   Narrative:    Performed at:  [address] [place], [place]  [id number] Lab Director: Jolene Schimke MD, Phone:  [phone]  SARS-COV-2, NAA 2 DAY TAT   Narrative:    Performed at:  [address] [place], [place]  [id number] Lab Director: Jolene Schimke MD, Phone:  [phone]    EKG   Radiology CLINICAL DATA:  39 year old male with history of cough and  congestion. Shortness of breath.  EXAM: CHEST - 2 VIEW  COMPARISON:  Chest x-ray 09/18/2011.  FINDINGS: Lung volumes are normal. No  consolidative airspace disease. No pleural effusions. No pneumothorax. No pulmonary nodule or mass noted. Pulmonary vasculature and the cardiomediastinal silhouette are within normal limits. Status post median sternotomy.  IMPRESSION: 1.  No radiographic evidence of acute cardiopulmonary disease.   Electronically Signed   By: Trudie Reed M.D.   On: 10/12/2019 11:44    Procedures Procedures (including critical care time)  Medications Ordered in UC Medications - No data to display  Initial Impression / Assessment and Plan / UC Course  I have reviewed the triage vital signs and the nursing notes.  Pertinent labs & imaging results that were available during my care of the patient were reviewed by me and considered in my medical decision making (see chart for details).     Reviewed imaging with pt althought normal CXR, pt started on doxycycline for persistent cough and fever Encouraged f/u with PCP Discussed symptoms that warrant emergent care in the ED. AVS given   Final Clinical Impressions(s) / UC Diagnoses   Final diagnoses:  Acute upper respiratory infection  Exposure to COVID-19 virus     Discharge Instructions      Call to schedule a follow up appointment with your primary care provider later this week for recheck of symptoms if not improving.  Call 911 or have someone drive you to the hospital if symptoms worsening- severe chest pain, trouble breathing, dizziness/passing out or other new concerning symptoms develop.   Due to concern for possibly having Covid-19, it is advised that you self-isolate at home until test results come back, usually 2-3 days.  If positive, it is recommended you stay isolated for at least 10 days after symptom onset and 24 hours after last fever without taking medication (whichever is  longer).  If you MUST go out, please wear a mask at all times, limit contact with others.   If your test is negative, you still have plenty of time to get the Covid vaccine. It is recommended you schedule an appointment to get your vaccine once you get over this current illness.  Please ask your primary care provider about any questions/concerns related to the vaccine.      ED Prescriptions    Medication Sig Dispense Auth. Provider   doxycycline (VIBRAMYCIN) 100 MG capsule Take 1 capsule (100 mg total) by mouth 2 (two) times daily for 7 days. 14 capsule Doroteo Glassman, Erin O, PA-C   benzonatate (TESSALON) 100 MG capsule Take 1-2 capsules (100-200 mg total) by mouth every 8 (eight) hours. 21 capsule Doroteo Glassman, Erin O, PA-C   predniSONE (DELTASONE) 20 MG tablet Take 2 tablets (40 mg total) by mouth daily with breakfast for 4 days. 8 tablet Doroteo Glassman, Erin O, PA-C   albuterol (VENTOLIN HFA) 108 (90 Base) MCG/ACT inhaler Inhale 1-2 puffs into the lungs every 6 (six) hours as needed for wheezing or shortness of breath. 18 g Lurene Shadow, PA-C     PDMP not reviewed this encounter.   Lurene Shadow, [place] 10/15/19 2034

## 2019-10-12 NOTE — Discharge Instructions (Addendum)
  Call to schedule a follow up appointment with your primary care provider later this week for recheck of symptoms if not improving.  Call 911 or have someone drive you to the hospital if symptoms worsening- severe chest pain, trouble breathing, dizziness/passing out or other new concerning symptoms develop.   Due to concern for possibly having Covid-19, it is advised that you self-isolate at home until test results come back, usually 2-3 days.  If positive, it is recommended you stay isolated for at least 10 days after symptom onset and 24 hours after last fever without taking medication (whichever is longer).  If you MUST go out, please wear a mask at all times, limit contact with others.   If your test is negative, you still have plenty of time to get the Covid vaccine. It is recommended you schedule an appointment to get your vaccine once you get over this current illness.  Please ask your primary care provider about any questions/concerns related to the vaccine.

## 2019-10-14 LAB — SARS-COV-2, NAA 2 DAY TAT

## 2019-10-14 LAB — NOVEL CORONAVIRUS, NAA: SARS-CoV-2, NAA: NOT DETECTED

## 2019-12-12 DIAGNOSIS — I10 Essential (primary) hypertension: Secondary | ICD-10-CM | POA: Insufficient documentation

## 2019-12-26 ENCOUNTER — Ambulatory Visit: Payer: Self-pay

## 2020-12-20 DIAGNOSIS — Z8774 Personal history of (corrected) congenital malformations of heart and circulatory system: Secondary | ICD-10-CM | POA: Insufficient documentation

## 2020-12-20 DIAGNOSIS — Z952 Presence of prosthetic heart valve: Secondary | ICD-10-CM | POA: Insufficient documentation

## 2021-04-16 ENCOUNTER — Emergency Department
Admission: RE | Admit: 2021-04-16 | Discharge: 2021-04-16 | Disposition: A | Discharge status: Home / Self Care | Payer: Self-pay | Source: Ambulatory Visit

## 2021-04-16 ENCOUNTER — Other Ambulatory Visit: Payer: Self-pay

## 2021-04-16 VITALS — BP 147/74 | HR 68 | Temp 97.8°F | Resp 20 | Ht 66.0 in | Wt 174.0 lb

## 2021-04-16 DIAGNOSIS — H6692 Otitis media, unspecified, left ear: Secondary | ICD-10-CM

## 2021-04-16 DIAGNOSIS — K122 Cellulitis and abscess of mouth: Secondary | ICD-10-CM

## 2021-04-16 DIAGNOSIS — H6122 Impacted cerumen, left ear: Secondary | ICD-10-CM

## 2021-04-16 MED ORDER — AMOXICILLIN-POT CLAVULANATE 875-125 MG PO TABS: 1.0000 | ORAL_TABLET | Freq: Two times a day (BID) | ORAL | 0 refills | Status: DC

## 2021-04-16 NOTE — ED Triage Notes (Signed)
Pt presents to Urgent Care with c/o L ear pain and L-sided throat pain x 3 days.

## 2021-04-16 NOTE — ED Provider Notes (Signed)
Randy Frank CARE    CSN: [id number] Arrival date & time: 04/16/21  1056      History   Chief Complaint Chief Complaint  Patient presents with   Otalgia   Sore Throat    HPI Randy Frank is a 41 y.o. male.   HPI 41 year old male presents with left ear pain and left-sided throat pain for 3 days.  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  Past Medical History:  Diagnosis Date   McArdle's disease [facility])     Patient Active Problem List   Diagnosis Date Noted   LACERATION OF FINGER 12/10/2008    Past Surgical History:  Procedure Laterality Date   ABDOMINAL AORTIC ANEURYSM REPAIR     AORTIC VALVE REPLACEMENT     CARDIAC SURGERY         Home Medications    Prior to Admission medications   Medication Sig Start Date End Date Taking? Authorizing Provider  amoxicillin-clavulanate (AUGMENTIN) 875-125 MG tablet Take 1 tablet by mouth every 12 (twelve) hours. 04/16/21  Yes Trevor Iha, FNP  citalopram (CELEXA) 10 MG tablet Take 1 tablet by mouth daily. 01/23/17  Yes [provider]  albuterol (VENTOLIN HFA) 108 (90 Base) MCG/ACT inhaler Inhale 1-2 puffs into the lungs every 6 (six) hours as needed for wheezing or shortness of breath. 10/12/19   Lurene Shadow, PA-C  ALPRAZolam Prudy Feeler) 1 MG tablet Take 1 mg by mouth at bedtime as needed.    [provider]  benzonatate (TESSALON) 100 MG capsule Take 1-2 capsules (100-200 mg total) by mouth every 8 (eight) hours. 10/12/19   Lurene Shadow, PA-C  cyclobenzaprine (FLEXERIL) 10 MG tablet Take 1 tablet (10 mg total) by mouth 3 (three) times daily. 11/16/17   Lattie Haw, MD  lidocaine (LIDODERM) 5 % Apply one patch to affected area daily.  Remove & Discard patch within 12 hours. 11/16/17   Lattie Haw, MD  lisinopril (ZESTRIL) 10 MG tablet Take 10 mg by mouth daily.    [provider]  metoprolol succinate (TOPROL-XL) 25 MG 24  hr tablet Take 25 mg by mouth daily.    [provider]  rivaroxaban (XARELTO) 10 MG TABS tablet Take 10 mg by mouth daily.    [provider]    Family History Family History  Problem Relation Age of Onset   Irritable bowel syndrome Mother    Cancer Mother        thyroid    Social History Social History   Tobacco Use   Smoking status: Never   Smokeless tobacco: Never  Vaping Use   Vaping Use: Never used  Substance Use Topics   Alcohol use: No   Drug use: No     Allergies   Dilaudid [hydromorphone]   Review of Systems Review of Systems  HENT:  Positive for ear pain and sore throat.   All other systems reviewed and are negative.   Physical Exam Triage Vital Signs ED Triage Vitals  Enc Vitals Group     BP 04/16/21 1119 (!) 147/74     Pulse Rate 04/16/21 1119 68     Resp 04/16/21 1119 20     Temp 04/16/21 1119 97.8 F (36.6 C)     Temp Source 04/16/21 1119 Oral     SpO2 04/16/21 1119 98 %     Weight 04/16/21 1109 174 lb (78.9 kg)     Height 04/16/21 1109  5\' 6"  (1.676 m)     Head Circumference --      Peak Flow --      Pain Score 04/16/21 1109 6     Pain Loc --      Pain Edu? --      Excl. in GC? --    No data found.  Updated Vital Signs BP (!) 147/74 (BP Location: Right Arm)    Pulse 68    Temp 97.8 F (36.6 C) (Oral)    Resp 20    Ht 5\' 6"  (1.676 m)    Wt 174 lb (78.9 kg)    SpO2 98%    BMI 28.08 kg/m     Physical Exam Vitals and nursing note reviewed.  Constitutional:      General: He is not in acute distress.    Appearance: Normal appearance. He is well-developed and normal weight. He is not ill-appearing.  HENT:     Head: Normocephalic and atraumatic.     Right Ear: Hearing, tympanic membrane, ear canal and external ear normal.     Left Ear: Hearing and external ear normal. There is impacted cerumen.     Ears:     Comments: Left ear: EAC-impacted cerumen unable to visualize TM; post Left EAC lavage: EAC-clear, Left  TM-erythematous, red rimmed, retracted    Mouth/Throat:     Mouth: Mucous membranes are moist.     Pharynx: Uvula midline. Pharyngeal swelling and uvula swelling present.  Eyes:     Extraocular Movements: Extraocular movements intact.     Conjunctiva/sclera: Conjunctivae normal.     Pupils: Pupils are equal, round, and reactive to light.  Cardiovascular:     Rate and Rhythm: Normal rate and regular rhythm.     Pulses: Normal pulses.     Heart sounds: Normal heart sounds.  Pulmonary:     Effort: Pulmonary effort is normal.     Breath sounds: Normal breath sounds.  Musculoskeletal:     Cervical back: Normal range of motion and neck supple. Tenderness present.  Lymphadenopathy:     Cervical: Cervical adenopathy present.  Skin:    General: Skin is warm and dry.  Neurological:     General: No focal deficit present.     Mental Status: He is alert and oriented to person, place, and time.     UC Treatments / Results  Labs (all labs ordered are listed, but only abnormal results are displayed) Labs Reviewed - No data to display  EKG   Radiology No results found.  Procedures Procedures (including critical care time)  Medications Ordered in UC Medications - No data to display  Initial Impression / Assessment and Plan / UC Course  I have reviewed the triage vital signs and the nursing notes.  Pertinent labs & imaging results that were available during my care of the patient were reviewed by me and considered in my medical decision making (see chart for details).     MDM: 1.  Uvulitis-Rx'd Augmentin; 2.  Impacted cerumen of left ear-resolved with ear lavage; 3.  Acute left otitis media-Rx'd Augmentin. Advised patient to take medication as directed with food to completion.  Encouraged patient to increase daily water intake while taking this medication.  Patient discharged home, hemodynamically stable. Final Clinical Impressions(s) / UC Diagnoses   Final diagnoses:  Uvulitis   Impacted cerumen of left ear  Acute left otitis media     Discharge Instructions      Advised patient to take medication as  directed with food to completion.  Encouraged patient to increase daily water intake while taking this medication.     ED Prescriptions     Medication Sig Dispense Auth. Provider   amoxicillin-clavulanate (AUGMENTIN) 875-125 MG tablet Take 1 tablet by mouth every 12 (twelve) hours. 14 tablet Trevor Iha, FNP      PDMP not reviewed this encounter.   Trevor Iha, FNP 04/16/21 1220

## 2021-04-16 NOTE — Discharge Instructions (Addendum)
Advised patient to take medication as directed with food to completion.  Encouraged patient to increase daily water intake while taking this medication. 

## 2021-08-17 ENCOUNTER — Ambulatory Visit: Payer: Self-pay

## 2021-09-11 ENCOUNTER — Ambulatory Visit: Payer: Self-pay

## 2021-09-12 ENCOUNTER — Emergency Department
Admission: RE | Admit: 2021-09-12 | Discharge: 2021-09-12 | Disposition: A | Discharge status: Home / Self Care | Payer: Self-pay | Source: Ambulatory Visit | Attending: Family Medicine | Admitting: Family Medicine

## 2021-09-12 ENCOUNTER — Other Ambulatory Visit: Payer: Self-pay

## 2021-09-12 VITALS — BP 158/91 | HR 83 | Temp 98.7°F | Resp 16 | Ht 66.0 in | Wt 175.0 lb

## 2021-09-12 DIAGNOSIS — R059 Cough, unspecified: Secondary | ICD-10-CM

## 2021-09-12 DIAGNOSIS — J0100 Acute maxillary sinusitis, unspecified: Secondary | ICD-10-CM

## 2021-09-12 DIAGNOSIS — J309 Allergic rhinitis, unspecified: Secondary | ICD-10-CM

## 2021-09-12 DIAGNOSIS — J039 Acute tonsillitis, unspecified: Secondary | ICD-10-CM

## 2021-09-12 DIAGNOSIS — J029 Acute pharyngitis, unspecified: Secondary | ICD-10-CM

## 2021-09-12 LAB — POCT RAPID STREP A (OFFICE): Rapid Strep A Screen: NEGATIVE

## 2021-09-12 MED ORDER — AMOXICILLIN-POT CLAVULANATE 875-125 MG PO TABS: 1.0000 | ORAL_TABLET | Freq: Two times a day (BID) | ORAL | 0 refills | Status: AC

## 2021-09-12 MED ORDER — FEXOFENADINE HCL 180 MG PO TABS: 180.0000 mg | ORAL_TABLET | Freq: Every day | ORAL | 0 refills | Status: AC

## 2021-09-12 MED ORDER — PROMETHAZINE-DM 6.25-15 MG/5ML PO SYRP: 5.0000 mL | ORAL_SOLUTION | Freq: Two times a day (BID) | ORAL | 0 refills | Status: DC | PRN

## 2021-09-12 MED ORDER — PREDNISONE 20 MG PO TABS: ORAL_TABLET | ORAL | 0 refills | Status: DC

## 2021-09-12 MED ORDER — BENZONATATE 200 MG PO CAPS: 200.0000 mg | ORAL_CAPSULE | Freq: Three times a day (TID) | ORAL | 0 refills | Status: AC | PRN

## 2021-09-12 NOTE — ED Provider Notes (Signed)
Randy Frank CARE    CSN: [id number] Arrival date & time: 09/12/21  0951      History   Chief Complaint Chief Complaint  Patient presents with   Sore Throat    I've been sick for over a week. Sore throatCongestion Coughing up clear mucusEars a little sore - Entered by patient    HPI Randy Frank is a 41 y.o. male.   HPI 41 year old male presents with sore throat, cough, congestion, bilateral ear pain x 10 days.  Patient states, " I feel like I am swallowing glass."  Past Medical History:  Diagnosis Date   McArdle's disease [facility])     Patient Active Problem List   Diagnosis Date Noted   LACERATION OF FINGER 12/10/2008    Past Surgical History:  Procedure Laterality Date   ABDOMINAL AORTIC ANEURYSM REPAIR     AORTIC VALVE REPLACEMENT     CARDIAC SURGERY         Home Medications    Prior to Admission medications   Medication Sig Start Date End Date Taking? Authorizing Provider  amoxicillin-clavulanate (AUGMENTIN) 875-125 MG tablet Take 1 tablet by mouth 2 (two) times daily for 10 days. 09/12/21 09/22/21 Yes Trevor Iha, FNP  benzonatate (TESSALON) 200 MG capsule Take 1 capsule (200 mg total) by mouth 3 (three) times daily as needed for up to 7 days for cough. 09/12/21 09/19/21 Yes Trevor Iha, FNP  fexofenadine [facility] ALLERGY) 180 MG tablet Take 1 tablet (180 mg total) by mouth daily for 15 days. 09/12/21 09/27/21 Yes Trevor Iha, FNP  predniSONE (DELTASONE) 20 MG tablet Take 3 tabs PO daily x 5 days. 09/12/21  Yes Trevor Iha, FNP  promethazine-dextromethorphan (PROMETHAZINE-DM) 6.25-15 MG/5ML syrup Take 5 mLs by mouth 2 (two) times daily as needed for cough. 09/12/21  Yes Trevor Iha, FNP  albuterol (VENTOLIN HFA) 108 (90 Base) MCG/ACT inhaler Inhale 1-2 puffs into the lungs every 6 (six) hours as needed for wheezing or shortness of breath. 10/12/19   Lurene Shadow, PA-C  ALPRAZolam Prudy Feeler) 1 MG tablet Take 1 mg by mouth at bedtime as needed.     [provider]  lisinopril (ZESTRIL) 10 MG tablet Take 10 mg by mouth daily.    [provider]  metoprolol succinate (TOPROL-XL) 25 MG 24 hr tablet Take 25 mg by mouth daily.    [provider]  rivaroxaban (XARELTO) 10 MG TABS tablet Take 10 mg by mouth daily.    [provider]    Family History Family History  Problem Relation Age of Onset   Irritable bowel syndrome Mother    Cancer Mother        thyroid    Social History Social History   Tobacco Use   Smoking status: Never   Smokeless tobacco: Never  Vaping Use   Vaping Use: Never used  Substance Use Topics   Alcohol use: Yes   Drug use: No     Allergies   Dilaudid [hydromorphone]   Review of Systems Review of Systems  HENT:  Positive for congestion, postnasal drip and sore throat.   Respiratory:  Positive for cough.   All other systems reviewed and are negative.    Physical Exam Triage Vital Signs ED Triage Vitals  Enc Vitals Group     BP 09/12/21 1004 (!) 158/91     Pulse Rate 09/12/21 1004 83     Resp 09/12/21 1004 16     Temp 09/12/21 1004 98.7 F (37.1 C)  Temp Source 09/12/21 1004 Oral     SpO2 09/12/21 1004 98 %     Weight 09/12/21 1005 175 lb (79.4 kg)     Height 09/12/21 1005 5\' 6"  (1.676 m)     Head Circumference --      Peak Flow --      Pain Score 09/12/21 1004 7     Pain Loc --      Pain Edu? --      Excl. in GC? --    No data found.  Updated Vital Signs BP (!) 158/91 (BP Location: Left Arm)   Pulse 83   Temp 98.7 F (37.1 C) (Oral)   Resp 16   Ht 5\' 6"  (1.676 m)   Wt 175 lb (79.4 kg)   SpO2 98%   BMI 28.25 kg/m     Physical Exam Vitals and nursing note reviewed.  Constitutional:      Appearance: He is obese.  HENT:     Head: Normocephalic and atraumatic.     Right Ear: Tympanic membrane and external ear normal.     Left Ear: Tympanic membrane and external ear normal.     Ears:     Comments: Significant eustachian tube  dysfunction noted bilaterally    Nose:     Comments: Turbinates are erythematous/edematous    Mouth/Throat:     Mouth: Mucous membranes are moist.     Pharynx: Oropharynx is clear. Uvula midline. Posterior oropharyngeal erythema and uvula swelling present.     Tonsils: 3+ on the right. 3+ on the left.     Comments: Moderate to significant amount of clear drainage of posterior oropharynx noted Eyes:     Extraocular Movements: Extraocular movements intact.     Conjunctiva/sclera: Conjunctivae normal.     Pupils: Pupils are equal, round, and reactive to light.  Cardiovascular:     Rate and Rhythm: Normal rate and regular rhythm.     Pulses: Normal pulses.     Heart sounds: Normal heart sounds.  Pulmonary:     Effort: Pulmonary effort is normal.     Breath sounds: Normal breath sounds. No wheezing, rhonchi or rales.     Comments: Infrequent nonproductive cough noted on exam Musculoskeletal:     Cervical back: Normal range of motion and neck supple.  Skin:    General: Skin is warm and dry.  Neurological:     General: No focal deficit present.     Mental Status: He is alert and oriented to person, place, and time.      UC Treatments / Results  Labs (all labs ordered are listed, but only abnormal results are displayed) Labs Reviewed  POCT RAPID STREP A (OFFICE)    EKG   Radiology No results found.  Procedures Procedures (including critical care time)  Medications Ordered in UC Medications - No data to display  Initial Impression / Assessment and Plan / UC Course  I have reviewed the triage vital signs and the nursing notes.  Pertinent labs & imaging results that were available during my care of the patient were reviewed by me and considered in my medical decision making (see chart for details).    MDM: 1.  Acute maxillary sinusitis, recurrence not specified-Rx'd Augmentin; 2. Acute tonsillitis-Rx'd prednisone; 3.  Allergic rhinitis-Rx'd Allegra; 4.  Cough-Rx'd  Tessalon Perles, Promethazine DM. Instructed patient to take medication as directed with food to completion.  Advised patient to take prednisone and Allegra with first dose of Augmentin for the next  5 of 10 days advised may use Allegra as needed afterwards for concurrent postnasal drainage/drip.  Advised may use Tessalon Perles daily or as needed for cough.  Advised may use Promethazine DM in the evening or prior to sleep due to sedative effects.  Advised patient not to take Tessalon Perles and Promethazine DM together.  Encouraged patient to increase daily water intake while taking these medications.  Advised patient if symptoms worsen and/or unresolved please follow-up with PCP or here for further evaluation.  Patient discharged home, hemodynamically stable.  Final Clinical Impressions(s) / UC Diagnoses   Final diagnoses:  Sore throat  Acute maxillary sinusitis, recurrence not specified  Acute tonsillitis, unspecified etiology  Allergic rhinitis, unspecified seasonality, unspecified trigger  Cough, unspecified type     Discharge Instructions      Instructed patient to take medication as directed with food to completion.  Advised patient to take prednisone and Allegra with first dose of Augmentin for the next 5 of 10 days advised may use Allegra as needed afterwards for concurrent postnasal drainage/drip.  Advised may use Tessalon Perles daily or as needed for cough.  Advised may use Promethazine DM in the evening or prior to sleep due to sedative effects.  Advised patient not to take Tessalon Perles and Promethazine DM together.  Encouraged patient to increase daily water intake while taking these medications.  Advised patient if symptoms worsen and/or unresolved please follow-up with PCP or here for further evaluation.     ED Prescriptions     Medication Sig Dispense Auth. Provider   amoxicillin-clavulanate (AUGMENTIN) 875-125 MG tablet Take 1 tablet by mouth 2 (two) times daily for 10  days. 20 tablet Trevor Iha, FNP   predniSONE (DELTASONE) 20 MG tablet Take 3 tabs PO daily x 5 days. 15 tablet Trevor Iha, FNP   fexofenadine [facility] ALLERGY) 180 MG tablet Take 1 tablet (180 mg total) by mouth daily for 15 days. 15 tablet Trevor Iha, FNP   benzonatate (TESSALON) 200 MG capsule Take 1 capsule (200 mg total) by mouth 3 (three) times daily as needed for up to 7 days for cough. 40 capsule Trevor Iha, FNP   promethazine-dextromethorphan (PROMETHAZINE-DM) 6.25-15 MG/5ML syrup Take 5 mLs by mouth 2 (two) times daily as needed for cough. 118 mL Trevor Iha, FNP      PDMP not reviewed this encounter.   Trevor Iha, FNP 09/12/21 1111

## 2021-09-12 NOTE — ED Triage Notes (Signed)
Cough, sore throat, congestion, bi-lateral ear pain x 10 days. COVID TEST NEG 5 DAYS AGO.  Unvaccinated for Covid. Throat feels like I swallowed glass.

## 2021-09-12 NOTE — Discharge Instructions (Addendum)
Instructed patient to take medication as directed with food to completion.  Advised patient to take prednisone and Allegra with first dose of Augmentin for the next 5 of 10 days advised may use Allegra as needed afterwards for concurrent postnasal drainage/drip.  Advised may use Tessalon Perles daily or as needed for cough.  Advised may use Promethazine DM in the evening or prior to sleep due to sedative effects.  Advised patient not to take Tessalon Perles and Promethazine DM together.  Encouraged patient to increase daily water intake while taking these medications.  Advised patient if symptoms worsen and/or unresolved please follow-up with PCP or here for further evaluation.

## 2021-11-01 ENCOUNTER — Ambulatory Visit: Admission: EM | Admit: 2021-11-01 | Discharge: 2021-11-01 | Discharge status: Against Medical Advice | Payer: Self-pay

## 2021-11-01 ENCOUNTER — Ambulatory Visit: Admit: 2021-11-01 | Discharge status: Absent without leave | Payer: Self-pay

## 2021-12-13 IMAGING — DX DG CHEST 2V
2 series · 2 of 2 positions shown · non-contrast
Comparison: Chest x-ray 09/18/2011.

CLINICAL DATA: 38-year-old male with history of cough and
congestion. Shortness of breath.

EXAM:
CHEST - 2 VIEW

[chest pa]
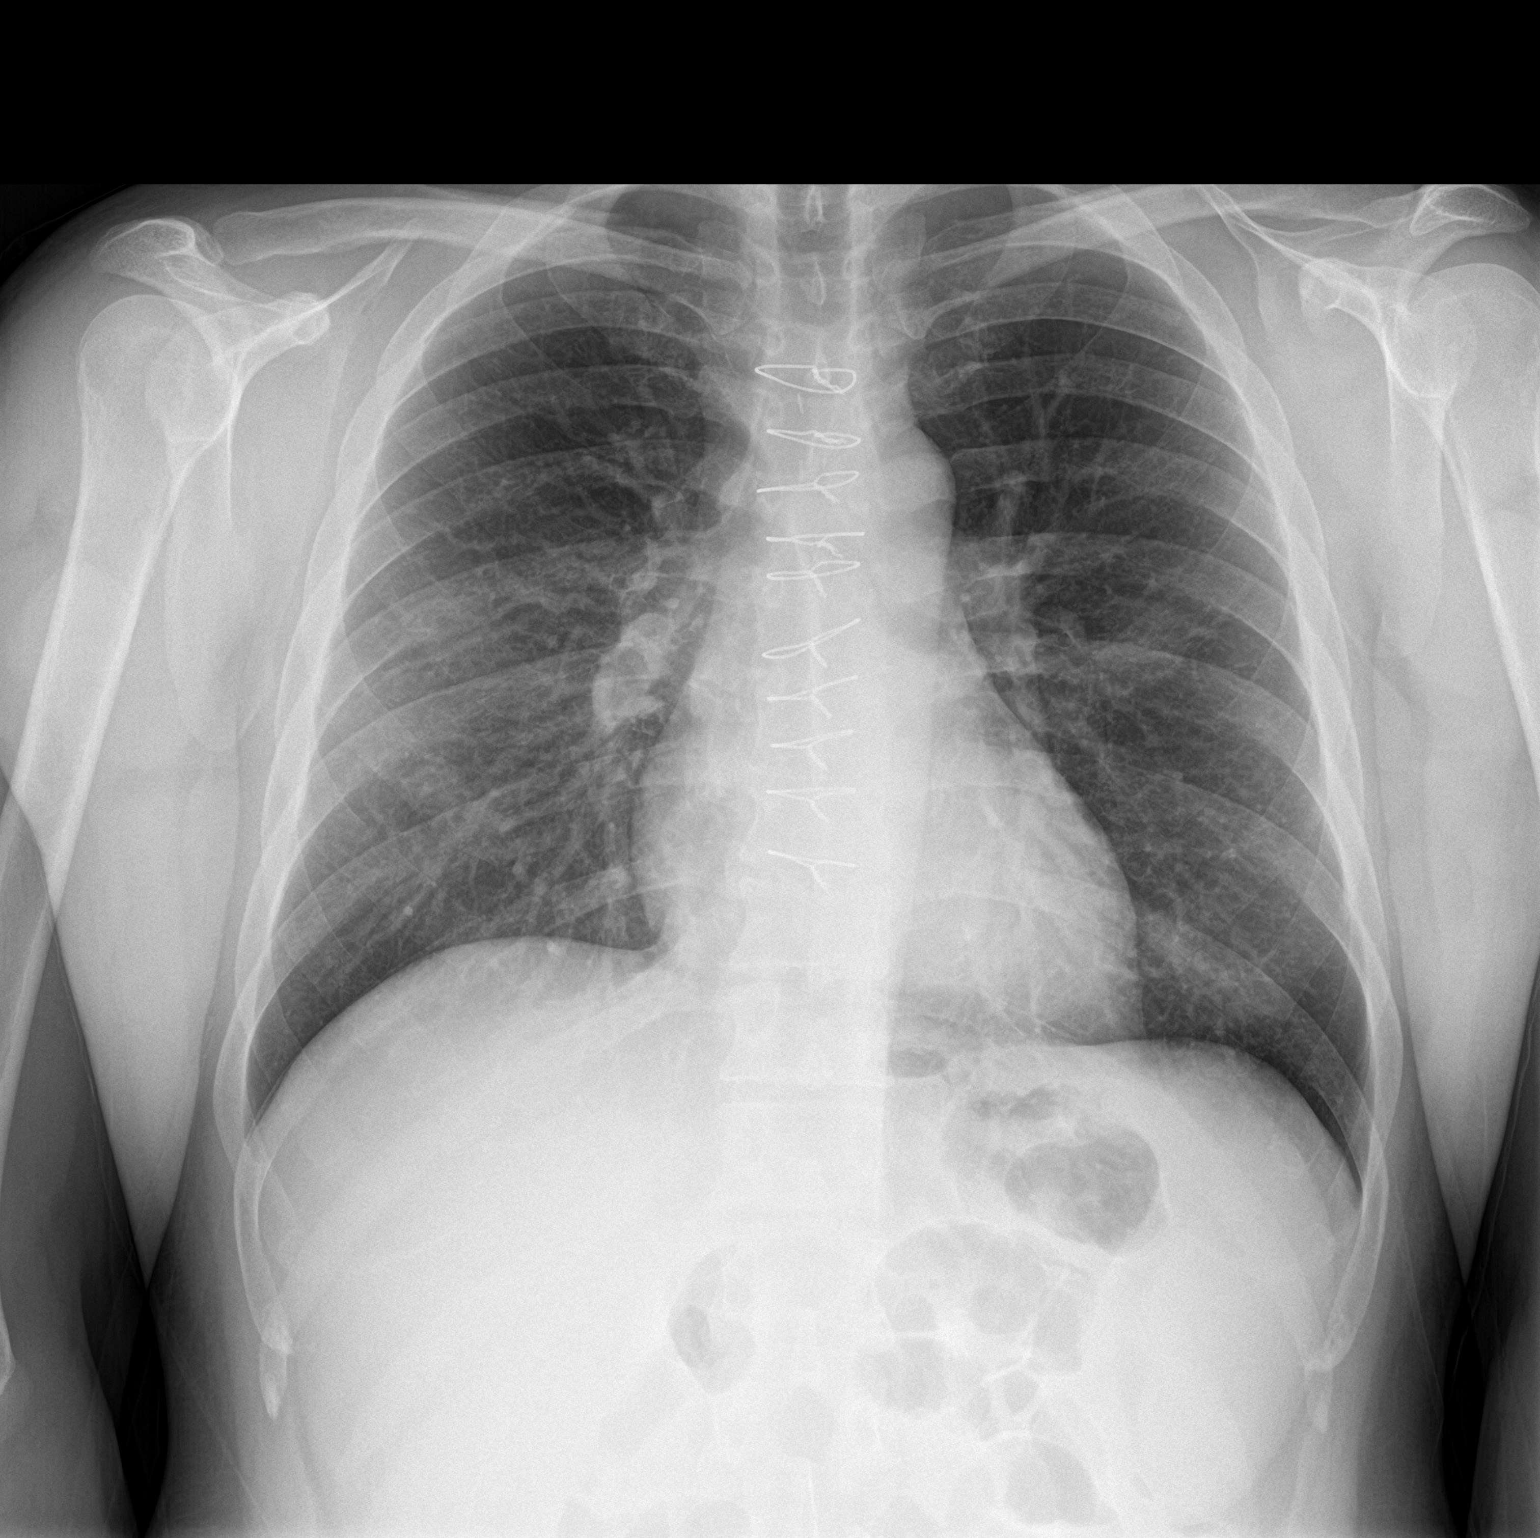

[chest lat]
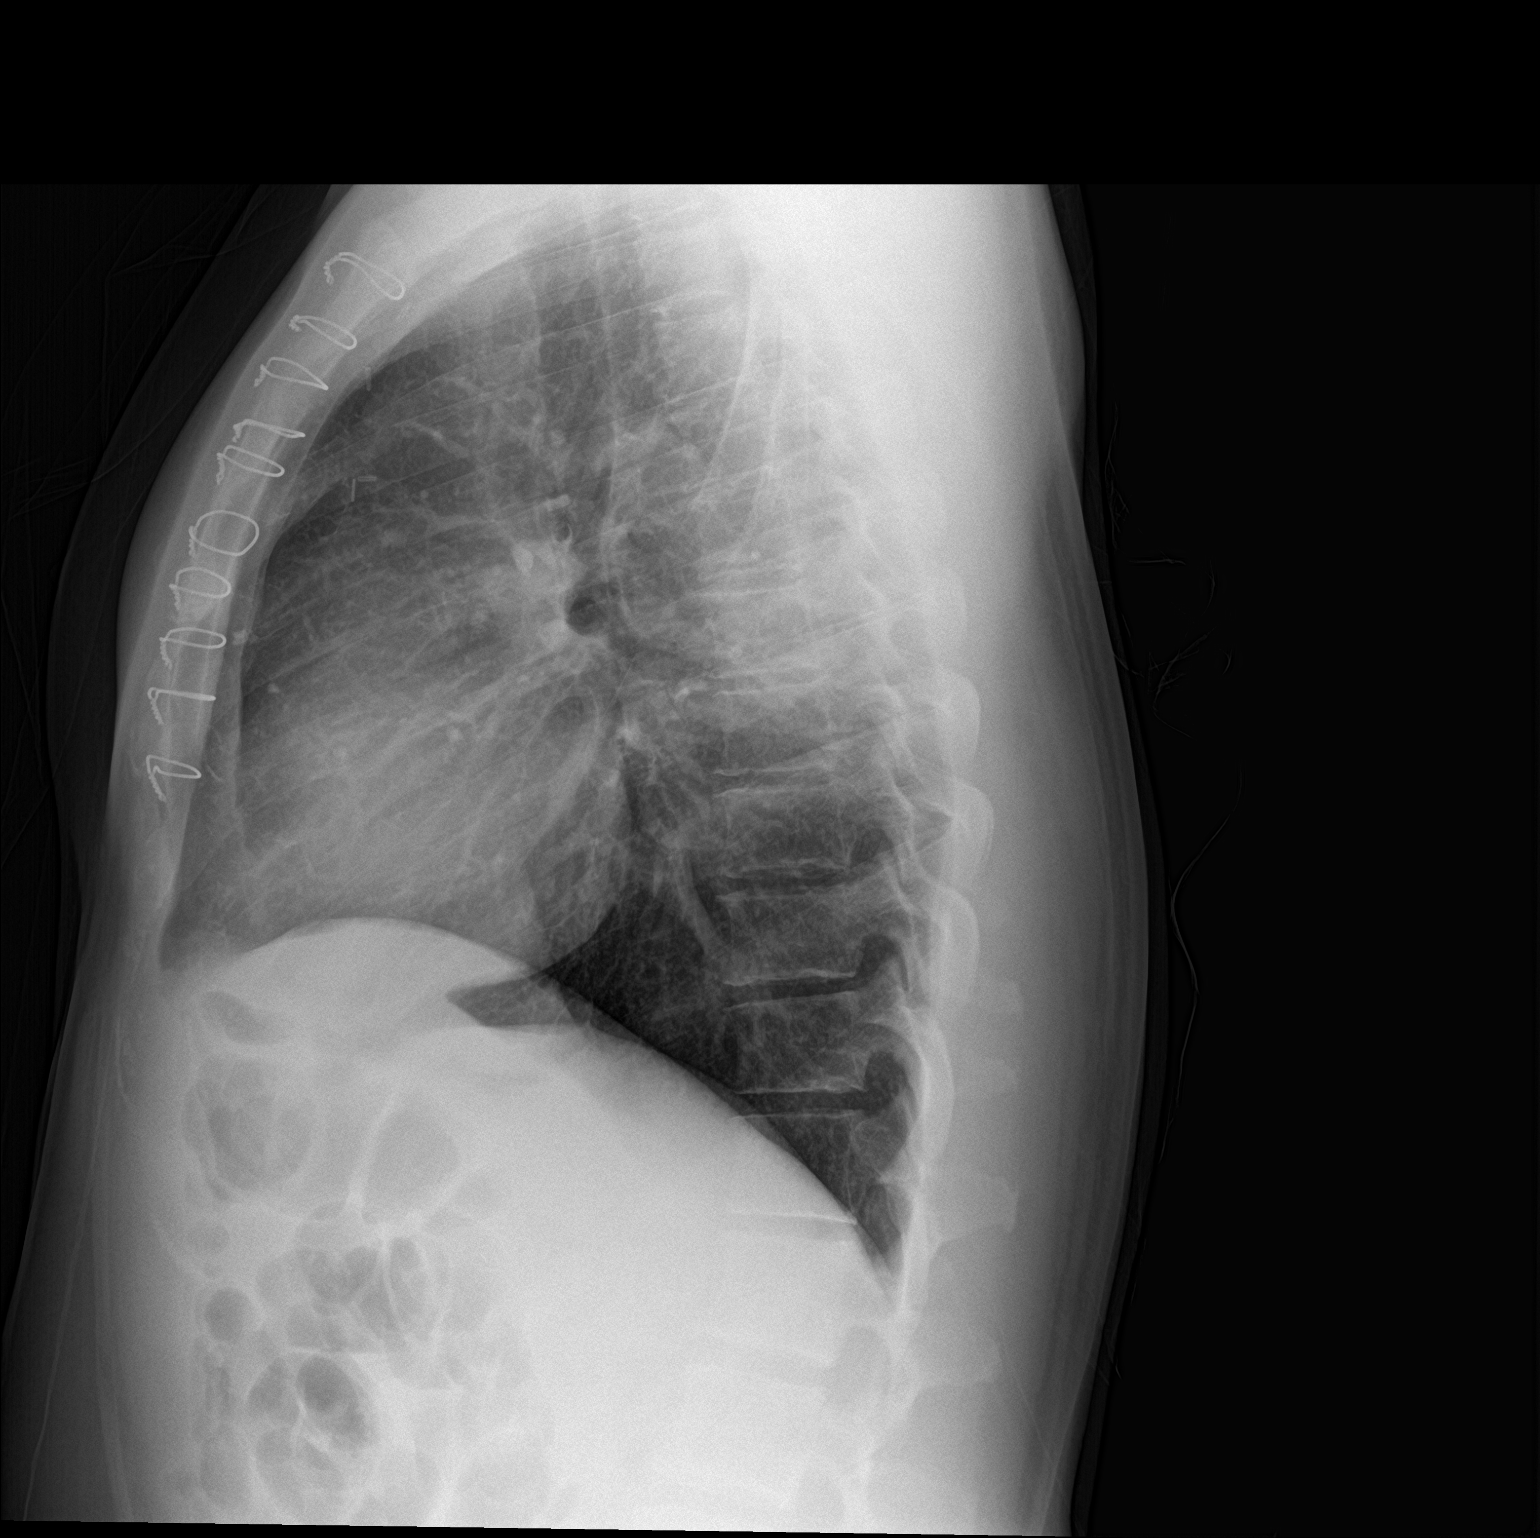

[2 of 2 positions shown; findings below may reference images not displayed]

FINDINGS: Lung volumes are normal. No consolidative airspace disease. No
pleural effusions. No pneumothorax. No pulmonary nodule or mass
noted. Pulmonary vasculature and the cardiomediastinal silhouette
are within normal limits. Status post median sternotomy.
IMPRESSION: 1.  No radiographic evidence of acute cardiopulmonary disease.

## 2022-04-07 ENCOUNTER — Ambulatory Visit: Payer: Self-pay

## 2022-07-29 ENCOUNTER — Ambulatory Visit
Admission: EM | Admit: 2022-07-29 | Discharge: 2022-07-29 | Disposition: A | Discharge status: Home / Self Care | Payer: 59 | Attending: Urgent Care | Admitting: Urgent Care

## 2022-07-29 ENCOUNTER — Other Ambulatory Visit: Payer: Self-pay

## 2022-07-29 DIAGNOSIS — N50812 Left testicular pain: Secondary | ICD-10-CM | POA: Diagnosis not present

## 2022-07-29 DIAGNOSIS — Z9852 Vasectomy status: Secondary | ICD-10-CM | POA: Diagnosis not present

## 2022-07-29 MED ORDER — ACETAMINOPHEN 325 MG PO TABS
650.0000 mg | ORAL_TABLET | Freq: Once | ORAL | Status: AC
  Administered 2022-07-29: 650 mg via ORAL

## 2022-07-29 NOTE — ED Notes (Signed)
Report  to [facility] ED charge RN, Inetta Fermo

## 2022-07-29 NOTE — ED Provider Notes (Signed)
[name] CARE    CSN: [id number] Arrival date & time: 07/29/22  0904      History   Chief Complaint Chief Complaint  Patient presents with   Testicle Pain    Bilateral     HPI MICHAELANTHONY RAYSOR is a 42 y.o. male.   Pleasant 42 year old male presents today due to concerns of testicular pain.  On 07/21/22 he underwent vasectomy with [facility].  States there were no immediate complications from the procedure, but he started developing significant pain primarily on the right testicle a few days later.  He presented to the emergency room on 07/27/2022 in which an ultrasound was performed.  He was found to have a small cyst, varicocele, and hydrocele on the right.  Patient was given a prescription for tramadol and sent home.  Patient did not pick up the tramadol as he states [facility] did not have it ready for him.  He has been taking Tylenol without significant relief.  He cannot take NSAIDs due to his Xarelto.  Patient was off his Xarelto for nearly 1 week, and restarted it again 2 days after his surgery.  Patient admits that today the pain is primarily in the left testicle.  He states last evening he did masturbate and self ejaculate, with severe and intense pain to his left testicle occurring roughly 6 hours afterwards.  The pain in the right has significantly subsided.  He denies a fever.  He states he can barely walk due to the pain.  He feels like there is a swollen area on the superior portion of his left testicle.  He denies bruising or swelling of the scrotum.  He has been using ice pack and elevation with tighter underwear with no relief.   Testicle Pain    Past Medical History:  Diagnosis Date   McArdle's disease [facility])     Patient Active Problem List   Diagnosis Date Noted   LACERATION OF FINGER 12/10/2008    Past Surgical History:  Procedure Laterality Date   ABDOMINAL AORTIC ANEURYSM REPAIR     AORTIC VALVE REPLACEMENT     CARDIAC SURGERY         Home  Medications    Prior to Admission medications   Medication Sig Start Date End Date Taking? Authorizing Provider  albuterol (VENTOLIN HFA) 108 (90 Base) MCG/ACT inhaler Inhale 1-2 puffs into the lungs every 6 (six) hours as needed for wheezing or shortness of breath. 10/12/19   Lurene Shadow, PA-C  ALPRAZolam Prudy Feeler) 1 MG tablet Take 1 mg by mouth at bedtime as needed.    [provider]  fexofenadine (ALLEGRA ALLERGY) 180 MG tablet Take 1 tablet (180 mg total) by mouth daily for 15 days. 09/12/21 09/27/21  Trevor Iha, FNP  lisinopril (ZESTRIL) 10 MG tablet Take 10 mg by mouth daily.    [provider]  metoprolol succinate (TOPROL-XL) 25 MG 24 hr tablet Take 25 mg by mouth daily.    [provider]  promethazine-dextromethorphan (PROMETHAZINE-DM) 6.25-15 MG/5ML syrup Take 5 mLs by mouth 2 (two) times daily as needed for cough. 09/12/21   Trevor Iha, FNP  rivaroxaban (XARELTO) 10 MG TABS tablet Take 10 mg by mouth daily.    [provider]    Family History Family History  Problem Relation Age of Onset   Irritable bowel syndrome Mother    Cancer Mother        thyroid    Social History Social History   Tobacco  Use   Smoking status: Never   Smokeless tobacco: Never  Vaping Use   Vaping Use: Never used  Substance Use Topics   Alcohol use: Yes   Drug use: No     Allergies   Dilaudid [hydromorphone]   Review of Systems Review of Systems  Genitourinary:  Positive for testicular pain.     Physical Exam Triage Vital Signs ED Triage Vitals  Enc Vitals Group     BP 07/29/22 0952 (!) 162/76     Pulse Rate 07/29/22 0952 78     Resp 07/29/22 0952 17     Temp 07/29/22 0952 97.8 F (36.6 C)     Temp Source 07/29/22 0952 Oral     SpO2 07/29/22 0952 100 %     Weight --      Height --      Head Circumference --      Peak Flow --      Pain Score 07/29/22 0951 5     Pain Loc --      Pain Edu? --      Excl. in GC? --    No data  found.  Updated Vital Signs BP (!) 162/76 (BP Location: Right Arm)   Pulse 78   Temp 97.8 F (36.6 C) (Oral)   Resp 17   SpO2 100%   Visual Acuity Right Eye Distance:   Left Eye Distance:   Bilateral Distance:    Right Eye Near:   Left Eye Near:    Bilateral Near:     Physical Exam Vitals and nursing note reviewed. Exam conducted with a chaperone present.  Constitutional:      General: He is in acute distress (moderately severe pain).     Appearance: Normal appearance. He is not toxic-appearing or diaphoretic.  HENT:     Head: Normocephalic and atraumatic.  Cardiovascular:     Rate and Rhythm: Normal rate.  Pulmonary:     Effort: Pulmonary effort is normal. No respiratory distress.  Genitourinary:    Pubic Area: No rash.      Penis: Normal and circumcised. No erythema, tenderness, discharge or lesions.      Testes:        Right: Testicular hydrocele present. Mass, tenderness or swelling not present.        Left: Tenderness, swelling and testicular hydrocele present.     Epididymis:     Right: Not inflamed or enlarged. No mass or tenderness.     Left: Inflamed and enlarged. Mass and tenderness present.  Lymphadenopathy:     Lower Body: No right inguinal adenopathy. No left inguinal adenopathy.  Skin:    General: Skin is warm and dry.     Coloration: Skin is not jaundiced.     Findings: No bruising or erythema.  Neurological:     Mental Status: He is alert.      UC Treatments / Results  Labs (all labs ordered are listed, but only abnormal results are displayed) Labs Reviewed - No data to display  EKG   Radiology No results found.  Procedures Procedures (including critical care time)  Medications Ordered in UC Medications  acetaminophen (TYLENOL) tablet 650 mg (650 mg Oral Given 07/29/22 1000)    Initial Impression / Assessment and Plan / UC Course  I have reviewed the triage vital signs and the nursing notes.  Pertinent labs & imaging results that  were available during my care of the patient were reviewed by me and considered in my  medical decision making (see chart for details).     L testicular pain -called and spoke with urology on-call, Dr. Ronne Binning.  We unfortunately do not have ultrasound at the urgent care today.  He discussed that repeat imaging despite 1 having been performed 2 days ago is required to rule out testicular torsion on the left given his degree of pain.  Abscess, hematoma, epididymoorchitis also possible causes.  Patient has failed to respond to pain medication ice and elevation.  Therefore sent patient to ER for repeat evaluation and pain control. Vasectomy status - ER for further evaluation, possible repeat US and labs   Final Clinical Impressions(s) / UC Diagnoses   Final diagnoses:  Testicular pain, left  Status post vasectomy     Discharge Instructions      After speaking with Dr. Ronne Binning, urology, he recommended repeat US to rule out torsion vs epididymal orchitis.  Pt was instructed to return to ER for repeat US imaging and pain medication.     ED Prescriptions   None    PDMP not reviewed this encounter.   Maretta Bees, [place] 07/29/22 1148

## 2022-07-29 NOTE — Discharge Instructions (Signed)
After speaking with Dr. Ronne Binning, urology, he recommended repeat US to rule out torsion vs epididymal orchitis.  Pt was instructed to return to ER for repeat US imaging and pain medication.

## 2022-07-29 NOTE — ED Notes (Signed)
Patient is being discharged from the Urgent Care and sent to the Emergency Department via POV. Per Guy Sandifer PA, patient is in need of higher level of care due to severe testicular pain post vasectomy and possible need for advanced imaging. Patient is aware and verbalizes understanding of plan of care.  Vitals:   07/29/22 0952  BP: (!) 162/76  Pulse: 78  Resp: 17  Temp: 97.8 F (36.6 C)  SpO2: 100%

## 2022-07-29 NOTE — ED Triage Notes (Signed)
Pt c/o bilateral testicular pain since 6/3. Pt is 8 days post vasectomy surgery. Was seen in ED 6/6, had ultrasound. Was rx'd tramadol but has not been filled. Taking tylenol prn. Takes xarelto so cannot take ibuprofen.

## 2022-09-12 ENCOUNTER — Ambulatory Visit
Admission: RE | Admit: 2022-09-12 | Discharge: 2022-09-12 | Disposition: A | Discharge status: Home / Self Care | Payer: 59 | Source: Ambulatory Visit

## 2022-09-12 ENCOUNTER — Ambulatory Visit: Payer: 59

## 2022-09-12 VITALS — BP 130/70 | HR 60 | Temp 97.5°F | Resp 16

## 2022-09-12 DIAGNOSIS — R1012 Left upper quadrant pain: Secondary | ICD-10-CM

## 2022-09-12 DIAGNOSIS — R7989 Other specified abnormal findings of blood chemistry: Secondary | ICD-10-CM

## 2022-09-12 DIAGNOSIS — R071 Chest pain on breathing: Secondary | ICD-10-CM

## 2022-09-12 DIAGNOSIS — R0789 Other chest pain: Secondary | ICD-10-CM

## 2022-09-12 MED ORDER — IOHEXOL 350 MG/ML SOLN
100.0000 mL | Freq: Once | INTRAVENOUS | Status: AC | PRN
  Administered 2022-09-12: 100 mL via INTRAVENOUS

## 2022-09-12 NOTE — Discharge Instructions (Addendum)
Advised patient of CT angio chest PE with contrast results with hardcopy provided to patient.  Advised patient we will follow-up with lab results once received.  Encouraged increase daily water intake to 64 ounces per day.  Advised if symptoms worsen and/or unresolved please follow-up with PCP or here for further evaluation.

## 2022-09-12 NOTE — ED Provider Notes (Signed)
[facility]    CSN: [id number] Arrival date & time: 09/12/22  1330      History   Chief Complaint Chief Complaint  Patient presents with   Abdominal Pain    Front rib/stomach pain. Hurts more when taking deep breaths. Stomach has been upset for a couple of days as well. Not sure if its stomach or muscle related, or something else. - Entered by patient    HPI Randy Frank is a 42 y.o. male.   HPI 42 year old male presents with left-sided upper quadrant/left anterior chest pain that began yesterday.  Reports sharp in nature and is worse with deep breath.  Reports pain is 6 of 10.  Patient is concerned about possible PE or other.  PMH significant for PE, abdominal aortic aneurysm repair and aortic valve replacement.  Patient is currently on rivaroxaban and denies any unusual bleeding.  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 Authorizing Provider  hydrOXYzine (ATARAX) 10 MG tablet 10 mg. 09/03/18  Yes [provider]  albuterol (VENTOLIN HFA) 108 (90 Base) MCG/ACT inhaler Inhale 1-2 puffs into the lungs every 6 (six) hours as needed for wheezing or shortness of breath. 10/12/19   Lurene Shadow, PA-C  ALPRAZolam Prudy Feeler) 1 MG tablet Take 1 mg by mouth at bedtime as needed.    [provider]  amLODipine (NORVASC) 5 MG tablet Take 5 mg by mouth daily.    [provider]  fexofenadine (ALLEGRA ALLERGY) 180 MG tablet Take 1 tablet (180 mg total) by mouth daily for 15 days. 09/12/21 09/27/21  Trevor Iha, FNP  lisinopril (ZESTRIL) 10 MG tablet Take 10 mg by mouth daily.    [provider]  metoprolol succinate (TOPROL-XL) 25 MG 24 hr tablet Take 25 mg by mouth daily.    [provider]  rivaroxaban (XARELTO) 10 MG TABS tablet Take 10 mg by mouth daily.    [provider]    Family History Family History  Problem Relation Age of Onset   Irritable bowel syndrome Mother    Cancer Mother        thyroid    Social History Social History   Tobacco Use   Smoking status: Never   Smokeless tobacco: Never  Vaping Use   Vaping status: Never Used  Substance Use Topics   Alcohol use: Yes   Drug use: No     Allergies   Dilaudid [hydromorphone]   Review of Systems Review of Systems  Cardiovascular:        Reporting pain under lateral 9th/10th ribs     Physical Exam Triage Vital Signs ED Triage Vitals [09/12/22 1339]  Encounter Vitals Group     BP      Systolic BP Percentile      Diastolic BP Percentile      Pulse      Resp      Temp      Temp src      SpO2      Weight  Height      Head Circumference      Peak Flow      Pain Score 6     Pain Loc      Pain Education      Exclude from Growth Chart    No data found.  Updated Vital Signs BP 130/70   Pulse 60   Temp (!) 97.5 F (36.4 C)   Resp 16   SpO2 98%       Physical Exam Vitals and nursing note reviewed.  Constitutional:      General: He is not in acute distress.    Appearance: Normal appearance. He is well-developed and normal weight. He is not ill-appearing or toxic-appearing.  HENT:     Head: Normocephalic and atraumatic.     Mouth/Throat:     Mouth: Mucous membranes are moist.     Pharynx: Oropharynx is clear.  Eyes:     Extraocular Movements: Extraocular movements intact.     Pupils: Pupils are equal, round, and reactive to light.  Cardiovascular:     Rate and Rhythm: Normal rate and regular rhythm.     Heart sounds: Normal heart sounds.  Pulmonary:     Effort: Pulmonary effort is normal.     Breath sounds: Normal breath sounds. No  wheezing, rhonchi or rales.     Comments: Anterior chest wall lateral inferior aspect (over 9th/10th rib) Chest:     Chest wall: Tenderness present.  Abdominal:     General: Abdomen is flat. Bowel sounds are normal. There is no distension or abdominal bruit. There are no signs of injury.     Palpations: Abdomen is soft. There is no shifting dullness, fluid wave, hepatomegaly, splenomegaly or mass.     Tenderness: There is abdominal tenderness in the left upper quadrant. There is no rebound. Negative signs include psoas sign.     Hernia: No hernia is present.  Skin:    General: Skin is warm and dry.  Neurological:     General: No focal deficit present.     Mental Status: He is alert and oriented to person, place, and time.  Psychiatric:        Mood and Affect: Mood normal.        Behavior: Behavior normal.      UC Treatments / Results  Labs (all labs ordered are listed, but only abnormal results are displayed) Labs Reviewed  COMPREHENSIVE METABOLIC PANEL  CBC WITH DIFFERENTIAL/PLATELET    EKG   Radiology CT Angio Chest PE W and/or Wo Contrast  Result Date: 09/12/2022 CLINICAL DATA:  Pulmonary embolism (PE) suspected, low to intermediate prob, positive D-dimer EXAM: CT ANGIOGRAPHY CHEST WITH CONTRAST TECHNIQUE: Multidetector CT imaging of the chest was performed using the standard protocol during bolus administration of intravenous contrast. Multiplanar CT image reconstructions and MIPs were obtained to evaluate the vascular anatomy. RADIATION DOSE REDUCTION: This exam was performed according to the departmental dose-optimization program which includes automated exposure control, adjustment of the mA and/or kV according to patient size and/or use of iterative reconstruction technique. CONTRAST:  OMNIPAQUE IOHEXOL 350 MG/ML SOLN COMPARISON:  None Available. FINDINGS: Cardiovascular: SVC patent. Heart size upper limits normal. No pericardial effusion. The RV is nondilated.  Satisfactory opacification of pulmonary arteries noted, and there is no evidence of pulmonary emboli. There is fair contrast opacification of the thoracic aorta. 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 Ectatic arch 3.7 cm diameter. No dissection or stenosis. Mediastinum/Nodes: No hematoma, mass, or adenopathy.  Lungs/Pleura: No pleural effusion. No pneumothorax. Mild somewhat geographic ground-glass opacities in the lung bases probably atelectasis. Lungs otherwise clear. Upper Abdomen: 3.5 cm right upper pole renal cyst incompletely visualized. No acute findings. Musculoskeletal: Sternotomy wires. No fracture or worrisome bone lesion. Review of the MIP images confirms the above findings. IMPRESSION: 1. Negative for acute PE or thoracic aortic dissection. 2. 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 3. Mild bibasilar atelectasis. Electronically Signed   By: Corlis Leak M.D.   On: 09/12/2022 15:25    Procedures Procedures (including critical care time)  Medications Ordered in UC Medications - No data to display  Initial Impression / Assessment and Plan / UC Course  I have reviewed the triage vital signs and the nursing notes.  Pertinent labs & imaging results that were available during my care of the patient were reviewed by me and considered in my medical decision making (see chart for details).     MDM: 1.  Chest pain varying with breathing-CT angio PE with contrast revealed above; 2. Left upper quadrant discomfort-CBC with differential, CMP ordered; 3.  Anterior chest wall pain-CT angio PE with contrast revealed above. Advised patient of CT angio chest PE with contrast results with hardcopy provided to patient.  Advised patient we will follow-up with lab results once received.  Encouraged increase daily water intake to 64 ounces per day.  Advised if symptoms worsen and/or unresolved please follow-up with PCP or here for further evaluation.  Patient discharged home, hemodynamically stable. Final  Clinical Impressions(s) / UC Diagnoses   Final diagnoses:  Chest pain varying with breathing  Anterior chest wall pain  LUQ discomfort     Discharge Instructions      Advised patient of CT angio chest PE with contrast results with hardcopy provided to patient.  Advised patient we will follow-up with lab results once received.  Encouraged increase daily water intake to 64 ounces per day.  Advised if symptoms worsen and/or unresolved please follow-up with PCP or here for further evaluation.     ED Prescriptions   None    PDMP not reviewed this encounter.   Trevor Iha, FNP 09/12/22 1555

## 2022-09-12 NOTE — ED Triage Notes (Signed)
Pt presents to uc with co of left sided upper quad pain that is sharp in nature and started yesterday. Worsens with deep breath. Pt reports pain is a 6/10

## 2022-09-13 ENCOUNTER — Telehealth: Payer: Self-pay | Admitting: Emergency Medicine

## 2022-09-13 LAB — COMPREHENSIVE METABOLIC PANEL
ALT: 74 IU/L — ABNORMAL HIGH (ref 0–44)
AST: 57 IU/L — ABNORMAL HIGH (ref 0–40)
Albumin: 4.6 g/dL (ref 4.1–5.1)
Alkaline Phosphatase: 59 IU/L (ref 44–121)
BUN/Creatinine Ratio: 21 — ABNORMAL HIGH (ref 9–20)
BUN: 19 mg/dL (ref 6–24)
Bilirubin Total: 0.4 mg/dL (ref 0.0–1.2)
CO2: 26 mmol/L (ref 20–29)
Calcium: 9.9 mg/dL (ref 8.7–10.2)
Chloride: 100 mmol/L (ref 96–106)
Creatinine, Ser: 0.9 mg/dL (ref 0.76–1.27)
Globulin, Total: 2.2 g/dL (ref 1.5–4.5)
Glucose: 84 mg/dL (ref 70–99)
Potassium: 4.2 mmol/L (ref 3.5–5.2)
Sodium: 138 mmol/L (ref 134–144)
Total Protein: 6.8 g/dL (ref 6.0–8.5)
eGFR: 109 mL/min/{1.73_m2} (ref >59)

## 2022-09-13 LAB — CBC WITH DIFFERENTIAL/PLATELET
Basophils Absolute: 0 10*3/uL (ref 0.0–0.2)
Basos: 1 %
EOS (ABSOLUTE): 0.2 10*3/uL (ref 0.0–0.4)
Eos: 3 %
Hematocrit: 42.2 % (ref 37.5–51.0)
Hemoglobin: 14.1 g/dL (ref 13.0–17.7)
Immature Grans (Abs): 0 10*3/uL (ref 0.0–0.1)
Immature Granulocytes: 0 %
Lymphocytes Absolute: 1.5 10*3/uL (ref 0.7–3.1)
Lymphs: 23 %
MCH: 29.3 pg (ref 26.6–33.0)
MCHC: 33.4 g/dL (ref 31.5–35.7)
MCV: 88 fL (ref 79–97)
Monocytes Absolute: 0.7 10*3/uL (ref 0.1–0.9)
Monocytes: 11 %
Neutrophils Absolute: 4.1 10*3/uL (ref 1.4–7.0)
Neutrophils: 62 %
Platelets: 198 10*3/uL (ref 150–450)
RBC: 4.81 x10E6/uL (ref 4.14–5.80)
RDW: 12.4 % (ref 11.6–15.4)
WBC: 6.5 10*3/uL (ref 3.4–10.8)

## 2022-09-13 NOTE — Telephone Encounter (Signed)
Opened in error

## 2023-03-23 ENCOUNTER — Ambulatory Visit
Admission: RE | Admit: 2023-03-23 | Discharge: 2023-03-23 | Disposition: A | Discharge status: Home / Self Care | Payer: 59 | Source: Ambulatory Visit | Attending: Family Medicine | Admitting: Family Medicine

## 2023-03-23 ENCOUNTER — Other Ambulatory Visit: Payer: Self-pay

## 2023-03-23 ENCOUNTER — Ambulatory Visit: Payer: 59

## 2023-03-23 VITALS — BP 109/62 | HR 60 | Temp 97.3°F | Resp 16

## 2023-03-23 DIAGNOSIS — M79674 Pain in right toe(s): Secondary | ICD-10-CM | POA: Diagnosis not present

## 2023-03-23 NOTE — ED Triage Notes (Signed)
Right foot 1st toe pain x 2-3 months but worse over the last week. No swelling or redness per patient. Pain with walking and bending toe. 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

## 2023-03-23 NOTE — Discharge Instructions (Signed)
For now, recommend acetaminophen 650mg , 2 tabs every 8 hours as needed for pain.

## 2023-03-23 NOTE — ED Provider Notes (Signed)
[name]    CSN: [id number] Arrival date & time: 03/23/23  1509      History   Chief Complaint Chief Complaint  Patient presents with   Foot Pain    HPI Randy Frank is a 43 y.o. male.   Patient complains of pain in the dorsal surface of his right great toe for 2 to 3 months, becoming worse during the past week.  He denies injury or changes in activities.  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  The history is provided by the patient.  Foot Pain This is a new problem. Episode onset: 2 to 3 months ago. The problem has been gradually worsening. The symptoms are aggravated by walking. The symptoms are relieved by position and rest. He has tried nothing for the symptoms.    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 Authorizing Provider  albuterol (VENTOLIN HFA) 108 (90 Base) MCG/ACT inhaler Inhale 1-2 puffs into the lungs every 6 (six) hours as needed for wheezing or shortness of breath. 10/12/19   Lurene Shadow, PA-C  ALPRAZolam Prudy Feeler) 1 MG tablet Take 1 mg by mouth at bedtime as needed.    [provider]  amLODipine (NORVASC) 5 MG tablet Take 5 mg by mouth daily.    [provider]  fexofenadine (ALLEGRA ALLERGY) 180 MG tablet Take 1 tablet (180 mg total) by mouth daily for 15 days. 09/12/21 09/27/21  Trevor Iha, FNP  hydrOXYzine (ATARAX) 10 MG tablet 10 mg. 09/03/18   [provider]  lisinopril (ZESTRIL) 10 MG tablet Take 10 mg by  mouth daily.    [provider]  metoprolol succinate (TOPROL-XL) 25 MG 24 hr tablet Take 25 mg by mouth daily.    [provider]  rivaroxaban (XARELTO) 10 MG TABS tablet Take 10 mg by mouth daily.    [provider]    Family History Family History  Problem Relation Age of Onset   Irritable bowel syndrome Mother    Cancer Mother        thyroid    Social History Social History   Tobacco Use   Smoking status: Never   Smokeless tobacco: Never  Vaping Use   Vaping status: Never Used  Substance Use Topics   Alcohol use: Yes   Drug use: No     Allergies   Dilaudid [hydromorphone]   Review of Systems Review of Systems  Constitutional:  Negative for activity change.  Musculoskeletal:  Negative for joint swelling.       Right great toe pain.  Skin:  Negative for color change, rash and wound.  All other systems reviewed and are negative.    Physical Exam Triage Vital Signs ED Triage Vitals  Encounter Vitals Group     BP 03/23/23 1537 109/62     Systolic BP Percentile --      Diastolic BP  Percentile --      Pulse Rate 03/23/23 1537 60     Resp 03/23/23 1537 16     Temp 03/23/23 1537 (!) 97.3 F (36.3 C)     Temp src --      SpO2 03/23/23 1537 98 %     Weight --      Height --      Head Circumference --      Peak Flow --      Pain Score 03/23/23 1541 7     Pain Loc --      Pain Education --      Exclude from Growth Chart --    No data found.  Updated Vital Signs BP 109/62   Pulse 60   Temp (!) 97.3 F (36.3 C)   Resp 16   SpO2 98%   Visual Acuity Right Eye Distance:   Left Eye Distance:   Bilateral Distance:    Right Eye Near:   Left Eye Near:    Bilateral Near:     Physical Exam Vitals and nursing note reviewed.  Constitutional:      General: He is not in acute distress. HENT:     Head: Normocephalic.  Eyes:     Pupils: Pupils are equal, round, and reactive to light.  Cardiovascular:     Rate and Rhythm:  Normal rate.  Pulmonary:     Effort: Pulmonary effort is normal.  Musculoskeletal:       Feet:     Comments: Right great toe has distinct point tenderness to palpation over the dorsal/medial edge of proximal phalanx as noted on diagram.  Pain is elicited by resisted dorsiflexion of the right great toe.    Skin:    General: Skin is warm and dry.  Neurological:     Mental Status: He is alert.      UC Treatments / Results  Labs (all labs ordered are listed, but only abnormal results are displayed) Labs Reviewed - No data to display  EKG   Radiology DG Toe Great Right Result Date: 03/23/2023 CLINICAL DATA:  Right great toe pain with dorsiflexion of the last 2 months EXAM: RIGHT GREAT TOE COMPARISON:  None Available. FINDINGS: Mild spurring of the base of the proximal phalanx of the great toe particularly dorsally core there appears to be a chronically fragmented well corticated osteophyte. Minimal spurring of the head of the first metatarsal. No acute bony findings. IMPRESSION: 1. Mild spurring of the base of the proximal phalanx of the great toe particularly dorsally, with a chronically fragmented osteophyte. 2. Minimal spurring of the head of the first metatarsal. Electronically Signed   By: Gaylyn Rong M.D.   On: 03/23/2023 17:41    Procedures Procedures (including critical care time)  Medications Ordered in UC Medications - No data to display  Initial Impression / Assessment and Plan / UC Course  I have reviewed the triage vital signs and the nursing notes.  Pertinent labs & imaging results that were available during my care of the patient were reviewed by me and considered in my medical decision making (see chart for details).  Suspect combination of osteoarthritis and tendonitis.  NSAID's are contraindicated because of his Xarelto. Followup with Dr. Rodney Langton ([facility]) for further evaluation and management.  Final Clinical Impressions(s) /  UC Diagnoses   Final diagnoses:  Great toe pain, right     Discharge Instructions      For now, recommend acetaminophen 650mg , 2  tabs every 8 hours as needed for pain.     ED Prescriptions   None       Lattie Haw, MD 03/25/23 [id number]

## 2023-03-28 ENCOUNTER — Telehealth: Payer: Self-pay | Admitting: Sports Medicine

## 2023-03-28 NOTE — Telephone Encounter (Unsigned)
 Copied from CRM [id number]. Topic: Appointments - Scheduling Inquiry for Clinic >> Mar 28, 2023  1:11 PM Bascom RAMAN wrote: Reason for CRM: Patient calling to schedule an appt with Dr T for right foot bone spur on joint as diagnosed at Urgent Care. Patient's callback number is [phone]

## 2023-03-29 ENCOUNTER — Ambulatory Visit: Payer: 59 | Admitting: Sports Medicine

## 2023-03-29 ENCOUNTER — Ambulatory Visit: Payer: 59

## 2023-03-29 DIAGNOSIS — E7404 McArdle disease: Secondary | ICD-10-CM | POA: Insufficient documentation

## 2023-03-29 DIAGNOSIS — M79644 Pain in right finger(s): Secondary | ICD-10-CM

## 2023-03-29 DIAGNOSIS — M19079 Primary osteoarthritis, unspecified ankle and foot: Secondary | ICD-10-CM | POA: Diagnosis not present

## 2023-03-29 DIAGNOSIS — Z9889 Other specified postprocedural states: Secondary | ICD-10-CM | POA: Insufficient documentation

## 2023-03-29 MED ORDER — DICLOFENAC SODIUM 1 % EX GEL
4.0000 g | Freq: Four times a day (QID) | CUTANEOUS | 11 refills | Status: AC
Start: 2023-03-29 — End: ?

## 2023-03-29 MED ORDER — ACETAMINOPHEN ER 650 MG PO TBCR
650.0000 mg | EXTENDED_RELEASE_TABLET | Freq: Three times a day (TID) | ORAL | Status: AC | PRN
Start: 2023-03-29 — End: ?

## 2023-03-29 NOTE — Assessment & Plan Note (Signed)
 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 He had immediate pain and loss of strength. On exam today he has mild welling of third MCP. He has good strength of extension and flexion at the DIP, PIP, MCP Collaterals are stable. I do suspect a sprain of the MCP, he will do arthritis Tylenol , topical Voltaren , we will get some x-rays, return to see me in 4 to 6 weeks. Home PT given.

## 2023-03-29 NOTE — Progress Notes (Signed)
    Procedures performed today:    None.  Independent interpretation of notes and tests performed by another provider:   None.  Brief History, Exam, Impression, and Recommendations:    Primary localized osteoarthritis of toe, right foot Pleasant 42 year old male with a fairly complex medical history on Xarelto. 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 Pain is localized predominately at the interphalangeal joint, he was seen in urgent care and x-rays did show osteoarthritis with osteophytosis at the MTP. He does not have any hallux valgus, hallux limitus or hallux rigidus. Otherwise normal-appearing foot. I have suggested conservative treatment first, he will maximize acetaminophen  and switch to arthritis strength 3 times daily as well as topical Voltaren , I would also like him to try a Morton's plate. He will get this from [employer]. We can see him back in about 4 to 6 weeks and if insufficient improvement we will consider injection +/- referral for custom molded orthotics.  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 He had immediate pain and loss of strength. On exam today he has mild welling of third MCP. He has good strength of extension and flexion at the DIP, PIP, MCP Collaterals are stable. I do suspect a sprain of the MCP, he will do arthritis Tylenol , topical Voltaren , we will get some x-rays, return to see me in 4 to 6 weeks. Home PT given.  Chronic process not at goal with pharmacologic intervention  ____________________________________________ Debby PARAS. Curtis, M.D., ABFM., CAQSM., AME. Primary Care and Sports Medicine [facility]  Adjunct Professor of [facility] Medicine  [employer]  Rest[employer], [employer]

## 2023-03-29 NOTE — Patient Instructions (Signed)
 Randy Frank

## 2023-03-29 NOTE — Assessment & Plan Note (Signed)
 Pleasant 43 year old male with a fairly complex medical history on Xarelto. 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 Pain is localized predominately at the interphalangeal joint, he was seen in urgent care and x-rays did show osteoarthritis with osteophytosis at the MTP. He does not have any hallux valgus, hallux limitus or hallux rigidus. Otherwise normal-appearing foot. I have suggested conservative treatment first, he will maximize acetaminophen  and switch to arthritis strength 3 times daily as well as topical Voltaren , I would also like him to try a Morton's plate. He will get this from [employer]. We can see him back in about 4 to 6 weeks and if insufficient improvement we will consider injection +/- referral for custom molded orthotics.

## 2023-04-15 ENCOUNTER — Ambulatory Visit
Admission: RE | Admit: 2023-04-15 | Discharge: 2023-04-15 | Disposition: A | Discharge status: Home / Self Care | Payer: 59 | Source: Ambulatory Visit

## 2023-04-15 ENCOUNTER — Other Ambulatory Visit: Payer: Self-pay

## 2023-04-15 ENCOUNTER — Emergency Department (HOSPITAL_BASED_OUTPATIENT_CLINIC_OR_DEPARTMENT_OTHER): Payer: 59

## 2023-04-15 ENCOUNTER — Encounter (HOSPITAL_BASED_OUTPATIENT_CLINIC_OR_DEPARTMENT_OTHER): Payer: Self-pay

## 2023-04-15 ENCOUNTER — Emergency Department (HOSPITAL_BASED_OUTPATIENT_CLINIC_OR_DEPARTMENT_OTHER)
Admission: EM | Admit: 2023-04-15 | Discharge: 2023-04-15 | Disposition: A | Discharge status: Home / Self Care | Payer: 59 | Attending: Emergency Medicine | Admitting: Emergency Medicine

## 2023-04-15 VITALS — BP 147/76 | HR 63 | Temp 97.7°F | Resp 16

## 2023-04-15 DIAGNOSIS — I809 Phlebitis and thrombophlebitis of unspecified site: Secondary | ICD-10-CM

## 2023-04-15 DIAGNOSIS — Z7901 Long term (current) use of anticoagulants: Secondary | ICD-10-CM | POA: Insufficient documentation

## 2023-04-15 DIAGNOSIS — M25862 Other specified joint disorders, left knee: Secondary | ICD-10-CM | POA: Insufficient documentation

## 2023-04-15 HISTORY — DX: Presence of other heart-valve replacement: Z95.4

## 2023-04-15 HISTORY — DX: Personal history of pulmonary embolism: Z86.711

## 2023-04-15 NOTE — ED Provider Notes (Signed)
 [place] [facility] Provider Note   CSN: [id number] Arrival date & time: 04/15/23  4782     History  Chief Complaint  Patient presents with   Knee Pain    Randy Frank is a 43 y.o. male with history of DVT and PE, aortic valve replacement, presents with concern for a bump to his left knee.  States he first noticed the bump about 1 week ago.  It is not painful, does not restrict his range of motion.  He was evaluated by his PCP and referred here for evaluation of possible DVT.  He does report being compliant with his Xarelto, has been on this medication since 2016. Denies any chest pain or shortness of breath.   Knee Pain      Home Medications Prior to Admission medications   Medication Sig Start Date End Date Taking? Authorizing Provider  acetaminophen (TYLENOL) 650 MG CR tablet Take 1 tablet (650 mg total) by mouth every 8 (eight) hours as needed for pain. 03/29/23   Monica Becton, MD  albuterol (VENTOLIN HFA) 108 (90 Base) MCG/ACT inhaler Inhale 1-2 puffs into the lungs every 6 (six) hours as needed for wheezing or shortness of breath. 10/12/19   Lurene Shadow, PA-C  ALPRAZolam Prudy Feeler) 0.25 MG tablet Take by mouth. 04/09/23   [provider]  ALPRAZolam Prudy Feeler) 1 MG tablet Take 1 mg by mouth at bedtime as needed.    [provider]  amLODipine (NORVASC) 5 MG tablet Take 5 mg by mouth daily.    [provider]  diclofenac Sodium (VOLTAREN) 1 % GEL Apply 4 g topically 4 (four) times daily. To affected joint. 03/29/23   Monica Becton, MD  escitalopram (LEXAPRO) 10 MG tablet Take 10 mg by mouth daily. 04/13/23   [provider]  fexofenadine (ALLEGRA ALLERGY) 180 MG tablet Take 1 tablet (180 mg total) by mouth daily for 15 days. 09/12/21 09/27/21  Trevor Iha, FNP  hydrOXYzine (ATARAX) 10 MG tablet 10 mg. 09/03/18   [provider]  lisinopril (ZESTRIL) 10 MG tablet Take 10 mg by mouth  daily.    [provider]  metoprolol succinate (TOPROL-XL) 25 MG 24 hr tablet Take 25 mg by mouth daily.    [provider]  rivaroxaban (XARELTO) 10 MG TABS tablet Take 10 mg by mouth daily.    [provider]      Allergies    Dilaudid [hydromorphone]    Review of Systems   Review of Systems  Musculoskeletal:        Bump on  left knee    Physical Exam Updated Vital Signs BP (!) 145/76   Pulse 71   Temp 97.6 F (36.4 C) (Oral)   Ht 5\' 6"  (1.676 m)   Wt 79.4 kg   SpO2 100%   BMI 28.25 kg/m  Physical Exam Vitals and nursing note reviewed.  Constitutional:      Appearance: Normal appearance.  HENT:     Head: Atraumatic.  Cardiovascular:     Comments: 2+ dorsalis pedis pulses bilaterally Pulmonary:     Effort: Pulmonary effort is normal.  Musculoskeletal:     Comments: Approximately 1.5cm diameter well circumscribed cystic feeling structure appreciated underneath the skin of the left knee, by the distal aspect of the femur on the medial side of left knee. Non-tender to palpation  Able to fully flex and extend the left knee.  No erythema or edema of the left  lower extremity.  No tenderness palpation of the calf  Neurological:     General: No focal deficit present.     Mental Status: He is alert.     Comments: Intact sensation in the bilateral lower extremities  5/5 strength with resisted knee flexion, ankle plantarflexion and dorsiflexion bilaterally  Psychiatric:        Mood and Affect: Mood normal.        Behavior: Behavior normal.     ED Results / Procedures / Treatments   Labs (all labs ordered are listed, but only abnormal results are displayed) Labs Reviewed - No data to display  EKG None  Radiology US Venous Img Lower  Left (DVT Study) Result Date: 04/15/2023 CLINICAL DATA:  Knee not.  Bruising at the knee. EXAM: LEFT LOWER EXTREMITY VENOUS DOPPLER ULTRASOUND TECHNIQUE: Gray-scale sonography with compression, as well as  color and duplex ultrasound, were performed to evaluate the deep venous system(s) from the level of the common femoral vein through the popliteal and proximal calf veins. COMPARISON:  None Available. FINDINGS: VENOUS Normal compressibility of the common femoral, superficial femoral, and popliteal veins, as well as the visualized calf veins. Visualized portions of profunda femoral vein and great saphenous vein unremarkable. No filling defects to suggest DVT on grayscale or color Doppler imaging. Doppler waveforms show normal direction of venous flow, normal respiratory plasticity and response to augmentation. Limited views of the contralateral common femoral vein are unremarkable. OTHER Small ovoid subcutaneous lesion measuring 1.1 x 0.6 cm anterior to the LEFT knee. No vascularity. Limitations: none IMPRESSION: 1. No deep venous thrombosis. 2. Small indeterminate avascular lesion in the subcutaneous LEFT knee. Potential hematoma. Electronically Signed   By: Genevive Bi M.D.   On: 04/15/2023 11:01    Procedures .Ultrasound ED Soft Tissue  Date/Time: 04/15/2023 10:51 AM  Performed by: Arabella Merles, PA-C Authorized by: Arabella Merles, PA-C   Procedure details:    Indications comment:  Eval for knee "Knot"   Transverse view:  Visualized   Longitudinal view:  Visualized   Images: archived   Location:    Location comment:  Left knee Comments:     Approximately 1.5cm diameter cystic structure visualized just inferior medial to the distal femur of the left knee     Medications Ordered in ED Medications - No data to display  ED Course/ Medical Decision Making/ A&P                                 Medical Decision Making    Differential diagnosis includes but is not limited to DVT, ganglion cyst, Baker's cyst, malignancy  ED Course:  Patient well-appearing, stable vital signs.  Has a cystic feeling structure of the anterior left knee.  I ultrasounded this area and can appreciate  a approximately 1.5 cm diameter fluid-filled structure just inferior medial to the distal femur.  Given patient's history of DVT, he was concern for possible DVT.  DVT ultrasound was obtained which showed no signs of DVT.  The area of concern was visualized on the ultrasound and was noted to be avascular.  Suspect hematoma or ganglion cyst.  He has full range of motion of the left knee, no overlying skin changes, neurovascular intact in the left lower extremity.  Low concern for any other emergent pathology at this time.  Patient stable and appropriate for discharge home this time.  Impression: Cystic structure of left knee  Disposition:  The patient was discharged home with instructions to have PCP monitor the cystlike structure in the left knee. Return precautions given.  Imaging Studies ordered: I ordered imaging studies including DVT ultrasound left lower extremity I independently visualized the imaging with scope of interpretation limited to determining acute life threatening conditions related to emergency care. Imaging showed no acute DVT, small ovoid subcutaneous lesion measuring approximately 1 cm in diameter with no associated vascularity I agree with the radiologist interpretation   External records from outside source obtained and reviewed including PCP note from earlier today where patient was seen for this complaint and sent here to rule out DVT              Final Clinical Impression(s) / ED Diagnoses Final diagnoses:  Knee joint cyst, left    Rx / DC Orders ED Discharge Orders     None         Arabella Merles, PA-C 04/15/23 1124    Cathren Laine, MD 04/16/23 [phone]

## 2023-04-15 NOTE — ED Triage Notes (Signed)
 Pt reports that he has a knot on left knee for a week. Pt is concerned for a blood clot since he has a history. Area is swollen and somewhat tender to touch. Taking eloquis. Hx of PE and valve replacement.

## 2023-04-15 NOTE — ED Provider Notes (Signed)
 [name]    CSN: [id number] Arrival date & time: 04/15/23  2952      History   Chief Complaint Chief Complaint  Patient presents with   Knee Pain    LT    HPI Randy Frank is a 43 y.o. male.   HPI 43 year old male presents with small bump above left knee reports noticed 1 week ago and is more pronounced with movement.  PMH significant for McArdle disease, history of aortic root repair, and s/p aortic valve replacement.  Patient is currently on rivaroxaban and denies any unusual bleeding.  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 Authorizing Provider  ALPRAZolam Prudy Feeler) 0.25 MG tablet Take by mouth. 04/09/23  Yes [provider]  escitalopram (LEXAPRO) 10 MG tablet Take 10 mg by mouth daily. 04/13/23  Yes [provider]  acetaminophen (TYLENOL) 650 MG CR tablet Take 1 tablet (650 mg total) by mouth every 8 (eight) hours as needed for pain. 03/29/23   Monica Becton, MD  albuterol (VENTOLIN HFA) 108 (90 Base) MCG/ACT inhaler Inhale 1-2 puffs into the lungs every 6 (six) hours as needed for wheezing or shortness of breath. 10/12/19   Lurene Shadow, PA-C  ALPRAZolam Prudy Feeler) 1 MG tablet Take 1 mg by mouth at bedtime as needed.    [provider]  amLODipine (NORVASC) 5 MG tablet Take 5 mg by mouth daily.    [provider]  diclofenac Sodium (VOLTAREN) 1 % GEL Apply 4 g topically 4 (four) times daily. To affected joint. 03/29/23   Monica Becton, MD  fexofenadine [facility] ALLERGY) 180 MG tablet Take 1 tablet (180 mg total) by mouth daily for 15 days. 09/12/21 09/27/21  Trevor Iha, FNP  hydrOXYzine (ATARAX) 10 MG tablet 10 mg. 09/03/18   [provider]  lisinopril (ZESTRIL) 10 MG tablet Take 10 mg by mouth daily.    [provider]  metoprolol succinate (TOPROL-XL) 25 MG 24 hr tablet Take 25 mg by mouth daily.    [provider]  rivaroxaban (XARELTO) 10 MG TABS tablet Take 10 mg by mouth daily.    [provider]    Family History Family History  Problem Relation Age of Onset   Irritable bowel syndrome Mother    Cancer Mother        thyroid    Social History Social History   Tobacco Use   Smoking status: Never   Smokeless tobacco: Never  Vaping Use   Vaping status: Never Used  Substance Use Topics   Alcohol use: Yes   Drug use: No     Allergies   Dilaudid [hydromorphone]   Review of Systems Review of Systems  Musculoskeletal:        Bump/soft tissue mass of left knee x 1 week     Physical Exam Triage Vital Signs ED Triage Vitals  Encounter Vitals Group     BP      Systolic BP Percentile      Diastolic BP Percentile      Pulse      Resp      Temp      Temp src      SpO2      Weight      Height      Head Circumference      Peak Flow      Pain Score      Pain Loc      Pain Education      Exclude from Growth Chart    No data found.  Updated Vital Signs BP (!) 147/76 (BP Location: Right Arm)   Pulse 63   Temp 97.7 F (36.5 C) (Oral)   Resp 16   SpO2 100%    Physical Exam Vitals and nursing note reviewed.  Constitutional:      Appearance: Normal appearance. He is normal weight.  HENT:     Head: Normocephalic and  atraumatic.     Mouth/Throat:     Mouth: Mucous membranes are moist.     Pharynx: Oropharynx is clear.  Eyes:     Extraocular Movements: Extraocular movements intact.     Conjunctiva/sclera: Conjunctivae normal.     Pupils: Pupils are equal, round, and reactive to light.  Cardiovascular:     Rate and Rhythm: Normal rate and regular rhythm.     Pulses: Normal pulses.     Heart sounds: Normal heart sounds.  Pulmonary:     Effort: Pulmonary effort is normal.     Breath sounds: Normal breath sounds. No wheezing, rhonchi or rales.  Musculoskeletal:        General: Normal range of motion.     Cervical back: Normal range of motion and neck supple.     Comments: Left knee (dorsum superior to patella): Mild soft tissue swelling notable mildly TTP to patient-see image below  Skin:    General: Skin is warm and dry.  Neurological:     General: No focal deficit present.     Mental Status: He is alert and oriented to person, place, and time. Mental status is at baseline.  Psychiatric:        Mood and Affect: Mood normal.        Behavior: Behavior normal.      UC Treatments / Results  Labs (all labs ordered are listed, but only abnormal results are displayed) Labs Reviewed - No data to display  EKG   Radiology No results found.  Procedures Procedures (including critical care time)  Medications Ordered in UC Medications - No data to display  Initial Impression / Assessment and Plan / UC Course  I have reviewed the triage vital signs and the nursing notes.  Pertinent labs & imaging results that were available during my care of the patient were reviewed by me and considered in my medical decision making (see chart for details).     MDM: 1.  Cyst of left knee joint-Advised patient to go to [facility] ED now for further evaluation of left knee cyst/thrombophlebitis to include ultrasound of this area.  More emergent given patient's medical history; 2.   Thrombophlebitis-patient is currently on rivaroxaban 10 mg tablet daily  cannot exclude DVT. Advised patient to go to [facility] ED now for further evaluation of left knee cyst/thrombophlebitis to include ultrasound of this area.  Patient agreed and verbalized understanding of these instructions and this plan of care this morning.  Advised patient [facility] ED has extremely low census right now with only 1 patient being evaluated. Final Clinical Impressions(s) / UC Diagnoses   Final diagnoses:  Cyst of left knee joint  Thrombophlebitis     Discharge Instructions      Advised patient to go to [facility] ED now for further evaluation of left knee cyst/thrombophlebitis to include ultrasound of this area.     ED Prescriptions   None    PDMP not reviewed this encounter.   Trevor Iha, FNP 04/15/23 [phone]

## 2023-04-15 NOTE — Discharge Instructions (Signed)
 Your ultrasound today did not show any signs of a DVT.  The knot you noticed may be a ganglion cyst or hematoma (collection of blood). Please have this monitored by your PCP, although they typically do not require any treatment.  Return to the ER for any inability to move your knee, redness of your knee, shortness of breath, chest pain, any other new or concerning symptoms  The ultrasound results are as below  EXAM:  LEFT LOWER EXTREMITY VENOUS DOPPLER ULTRASOUND    TECHNIQUE:  Gray-scale sonography with compression, as well as color and duplex  ultrasound, were performed to evaluate the deep venous system(s)  from the level of the common femoral vein through the popliteal and  proximal calf veins.    COMPARISON:  None Available.    FINDINGS:  VENOUS    Normal compressibility of the common femoral, superficial femoral,  and popliteal veins, as well as the visualized calf veins.  Visualized portions of profunda femoral vein and great saphenous  vein unremarkable. No filling defects to suggest DVT on grayscale or  color Doppler imaging. Doppler waveforms show normal direction of  venous flow, normal respiratory plasticity and response to  augmentation.    Limited views of the contralateral common femoral vein are  unremarkable.    OTHER    Small ovoid subcutaneous lesion measuring 1.1 x 0.6 cm anterior to  the LEFT knee. No vascularity.    Limitations: none    IMPRESSION:  1. No deep venous thrombosis.  2. Small indeterminate avascular lesion in the subcutaneous LEFT  knee. Potential hematoma.      Electronically Signed    By: Genevive Bi M.D.    On: 04/15/2023 11:01

## 2023-04-15 NOTE — Discharge Instructions (Addendum)
 Advised patient to go to [facility] ED now for further evaluation of left knee cyst/thrombophlebitis to include ultrasound of this area.

## 2023-04-15 NOTE — ED Notes (Signed)
 Patient is being discharged from the Urgent Care and sent to the Emergency Department via POV. Per Trevor Iha, patient is in need of higher level of care due to need for U/S to r/o DVT. Patient is aware and verbalizes understanding of plan of care.  Vitals:   04/15/23 0843  BP: (!) 147/76  Pulse: 63  Resp: 16  Temp: 97.7 F (36.5 C)  SpO2: 100%

## 2023-04-15 NOTE — ED Triage Notes (Addendum)
 Pt c/o LT knee pain x 1 week. Denies injury. Noticed small bump above the knee. Tender to touch.

## 2023-05-10 ENCOUNTER — Ambulatory Visit: Payer: 59 | Admitting: Sports Medicine

## 2023-10-23 ENCOUNTER — Encounter: Payer: Self-pay | Admitting: Sports Medicine

## 2023-10-25 ENCOUNTER — Ambulatory Visit: Admitting: Family Medicine

## 2024-02-16 ENCOUNTER — Ambulatory Visit
Admission: EM | Admit: 2024-02-16 | Discharge: 2024-02-16 | Disposition: A | Discharge status: Home / Self Care | Attending: Family Medicine | Admitting: Family Medicine

## 2024-02-16 ENCOUNTER — Other Ambulatory Visit: Payer: Self-pay

## 2024-02-16 DIAGNOSIS — K122 Cellulitis and abscess of mouth: Secondary | ICD-10-CM | POA: Diagnosis not present

## 2024-02-16 DIAGNOSIS — J029 Acute pharyngitis, unspecified: Secondary | ICD-10-CM

## 2024-02-16 LAB — POCT RAPID STREP A (OFFICE): Rapid Strep A Screen: NEGATIVE

## 2024-02-16 MED ORDER — AZITHROMYCIN 250 MG PO TABS: ORAL_TABLET | ORAL | 0 refills | Status: AC

## 2024-02-16 MED ORDER — DEXAMETHASONE 6 MG PO TABS
10.0000 mg | ORAL_TABLET | Freq: Once | ORAL | Status: AC
  Administered 2024-02-16: 10 mg via ORAL

## 2024-02-16 NOTE — ED Provider Notes (Signed)
 " Randy Frank    CSN: [id number] Arrival date & time: 02/16/24  1105      History   Chief Complaint Chief Complaint  Patient presents with   Sore Throat   Fatigue    HPI Randy Frank is a 43 y.o. male.   Has been feeling tired for a couple days.  Feels like he was coming down with something.  Woke up this morning with a painful sore throat.  Can hardly swallow.  His uvula is swollen and sitting on the back of his tongue.  Makes it hard to talk into swallow.  Very painful.  No fever.  No headache or bodyaches.  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 Authorizing Provider  azithromycin  (ZITHROMAX  Z-PAK) 250 MG tablet Take 2 pills right away.  After this take 1 pill a day until gone 02/16/24  Yes Maranda Jamee Jacob, MD  acetaminophen  (TYLENOL ) 650 MG CR tablet Take 1 tablet (650 mg total) by mouth every 8 (eight) hours as needed for pain. 03/29/23   Curtis Debby PARAS, MD  albuterol  (VENTOLIN  HFA) [phone] Base) MCG/ACT inhaler Inhale 1-2 puffs into the lungs every 6 (six) hours as needed for wheezing or shortness of breath. 10/12/19   Anitra Rocky KIDD, PA-C   ALPRAZolam (XANAX) 0.25 MG tablet Take by mouth. 04/09/23   [provider]  ALPRAZolam (XANAX) 1 MG tablet Take 1 mg by mouth at bedtime as needed.    [provider]  amLODipine (NORVASC) 5 MG tablet Take 5 mg by mouth daily.    [provider]  diclofenac  Sodium (VOLTAREN ) 1 % GEL Apply 4 g topically 4 (four) times daily. To affected joint. 03/29/23   Curtis Debby PARAS, MD  escitalopram (LEXAPRO) 10 MG tablet Take 10 mg by mouth daily. 04/13/23   [provider]  fexofenadine  (ALLEGRA  ALLERGY) 180 MG tablet Take 1 tablet (180 mg total) by mouth daily for 15 days. 09/12/21 09/27/21  Teddy Sharper, FNP  hydrOXYzine (ATARAX) 10 MG tablet 10 mg. 09/03/18   [provider]  lisinopril (ZESTRIL) 10 MG tablet Take 10 mg by mouth daily.    [provider]  metoprolol succinate (TOPROL-XL) 25 MG 24 hr tablet Take 25 mg by mouth daily.    [provider]  rivaroxaban (XARELTO) 10 MG TABS tablet Take 10 mg by mouth daily.    [provider]    Family History Family History  Problem Relation Age of Onset   Irritable bowel syndrome Mother    Cancer Mother  thyroid    Social History Social History[1]   Allergies   Dilaudid [hydromorphone]   Review of Systems Review of Systems See HPI  Physical Exam Triage Vital Signs ED Triage Vitals [02/16/24 1129]  Encounter Vitals Group     BP (!) 146/76     Girls Systolic BP Percentile      Girls Diastolic BP Percentile      Boys Systolic BP Percentile      Boys Diastolic BP Percentile      Pulse Rate 71     Resp 17     Temp 98.1 F (36.7 C)     Temp Source Oral     SpO2 97 %     Weight      Height      Head Circumference      Peak Flow      Pain Score 7     Pain Loc      Pain Education      Exclude from Growth Chart    No data found.  Updated Vital Signs BP (!) 146/76 (BP Location: Right Arm)   Pulse 71   Temp 98.1 F (36.7 C) (Oral)   Resp 17    SpO2 97%  :     Physical Exam Constitutional:      General: He is not in acute distress.    Appearance: He is well-developed and normal weight. He is not ill-appearing.  HENT:     Head: Normocephalic and atraumatic.     Right Ear: Tympanic membrane normal.     Left Ear: Tympanic membrane normal.     Nose: No congestion or rhinorrhea.     Mouth/Throat:     Mouth: Mucous membranes are moist.     Pharynx: Uvula midline. Pharyngeal swelling, posterior oropharyngeal erythema and uvula swelling present.     Tonsils: No tonsillar exudate. 1+ on the right. 1+ on the left.  Eyes:     Conjunctiva/sclera: Conjunctivae normal.     Pupils: Pupils are equal, round, and reactive to light.  Cardiovascular:     Rate and Rhythm: Normal rate.  Pulmonary:     Effort: Pulmonary effort is normal. No respiratory distress.  Musculoskeletal:        General: Normal range of motion.     Cervical back: Normal range of motion.  Lymphadenopathy:     Cervical: Cervical adenopathy present.  Skin:    General: Skin is warm and dry.  Neurological:     Mental Status: He is alert.      UC Treatments / Results  Labs (all labs ordered are listed, but only abnormal results are displayed) Labs Reviewed  POCT RAPID STREP A (OFFICE)    EKG   Radiology No results found.  Procedures Procedures (including critical Frank time)  Medications Ordered in UC Medications  dexamethasone  (DECADRON ) tablet 10 mg (10 mg Oral Given 02/16/24 1229)    Initial Impression / Assessment and Plan / UC Course  I have reviewed the triage vital signs and the nursing notes.  Pertinent labs & imaging results that were available during my Frank of the patient were reviewed by me and considered in my medical decision making (see chart for details).     Strep testing is negative.  Concern for swollen uvula.  Will give antibiotic coverage. Final Clinical Impressions(s) / UC Diagnoses   Final diagnoses:  Uvulitis  Acute  pharyngitis, unspecified etiology     Discharge Instructions      I  have given you a dose of Decadron .  This is a steroid to reduce the pain and swelling in your throat Take the azithromycin  as directed.  2 pills today in the morning until gone May try salt water gargles or Chloraseptic spray for your throat pain See your doctor if not improving by next week     ED Prescriptions     Medication Sig Dispense Auth. Provider   azithromycin  (ZITHROMAX  Z-PAK) 250 MG tablet Take 2 pills right away.  After this take 1 pill a day until gone 6 tablet Maranda Jamee Jacob, MD      PDMP not reviewed this encounter.     [1]  Social History Tobacco Use   Smoking status: Never   Smokeless tobacco: Never  Vaping Use   Vaping status: Never Used  Substance Use Topics   Alcohol use: Yes   Drug use: No     Maranda Jamee Jacob, MD 02/16/24 1702  "

## 2024-02-16 NOTE — Discharge Instructions (Signed)
 I have given you a dose of Decadron .  This is a steroid to reduce the pain and swelling in your throat Take the azithromycin  as directed.  2 pills today in the morning until gone May try salt water gargles or Chloraseptic spray for your throat pain See your doctor if not improving by next week

## 2024-02-16 NOTE — ED Triage Notes (Signed)
 Pt c/o sore throat and fatigue that started this am. No known fever. Salt water gargle and Tylenol  prn.
